# Patient Record
Sex: Female | Born: 1975 | Race: White | Hispanic: No | Marital: Single | Smoking: Former smoker
Health system: Southern US, Community
[De-identification: ages and names within clinical notes are randomized; demographics above are authoritative.]

## PROBLEM LIST (undated history)

## (undated) DIAGNOSIS — F1910 Other psychoactive substance abuse, uncomplicated: Secondary | ICD-10-CM

## (undated) DIAGNOSIS — D649 Anemia, unspecified: Secondary | ICD-10-CM

## (undated) DIAGNOSIS — F419 Anxiety disorder, unspecified: Secondary | ICD-10-CM

## (undated) DIAGNOSIS — R87629 Unspecified abnormal cytological findings in specimens from vagina: Secondary | ICD-10-CM

## (undated) DIAGNOSIS — M5430 Sciatica, unspecified side: Secondary | ICD-10-CM

## (undated) DIAGNOSIS — F32A Depression, unspecified: Secondary | ICD-10-CM

## (undated) DIAGNOSIS — F329 Major depressive disorder, single episode, unspecified: Secondary | ICD-10-CM

## (undated) HISTORY — DX: Unspecified abnormal cytological findings in specimens from vagina: R87.629

## (undated) HISTORY — PX: NO PAST SURGERIES: SHX2092

## (undated) HISTORY — PX: TUBAL LIGATION: SHX77

## (undated) HISTORY — DX: Anxiety disorder, unspecified: F41.9

## (undated) HISTORY — DX: Depression, unspecified: F32.A

## (undated) HISTORY — DX: Major depressive disorder, single episode, unspecified: F32.9

## (undated) HISTORY — PX: LEEP: SHX91

## (undated) HISTORY — DX: Anemia, unspecified: D64.9

---

## 2014-09-04 ENCOUNTER — Emergency Department (HOSPITAL_COMMUNITY)
Admission: EM | Admit: 2014-09-04 | Discharge: 2014-09-04 | Disposition: A | Discharge status: Home / Self Care | Payer: Self-pay | Attending: Emergency Medicine | Admitting: Emergency Medicine

## 2014-09-04 ENCOUNTER — Encounter (HOSPITAL_COMMUNITY): Payer: Self-pay | Admitting: Emergency Medicine

## 2014-09-04 DIAGNOSIS — Z79899 Other long term (current) drug therapy: Secondary | ICD-10-CM | POA: Insufficient documentation

## 2014-09-04 DIAGNOSIS — M5442 Lumbago with sciatica, left side: Secondary | ICD-10-CM | POA: Insufficient documentation

## 2014-09-04 DIAGNOSIS — Z720 Tobacco use: Secondary | ICD-10-CM | POA: Insufficient documentation

## 2014-09-04 MED ORDER — METHOCARBAMOL 500 MG PO TABS: 500.0000 mg | ORAL_TABLET | Freq: Two times a day (BID) | ORAL | Status: DC

## 2014-09-04 MED ORDER — HYDROCODONE-ACETAMINOPHEN 5-325 MG PO TABS: 1.0000 | ORAL_TABLET | ORAL | Status: DC | PRN

## 2014-09-04 NOTE — ED Provider Notes (Signed)
CSN: [id number]     Arrival date & time 09/04/14  1615 History   First MD Initiated Contact with Patient 09/04/14 1642     Chief Complaint  Patient presents with  . Back Pain     (Consider location/radiation/quality/duration/timing/severity/associated sxs/prior Treatment) Patient is a 39 y.o. female presenting with back pain. The history is provided by the patient.  Back Pain Location:  Lumbar spine Radiates to:  R posterior upper leg and L posterior upper leg Pain severity:  Severe Pain is:  Same all the time Onset quality:  Gradual Duration:  4 days Timing:  Constant Progression:  Worsening Chronicity:  New Relieved by:  Nothing Worsened by:  Movement, twisting, bending, ambulation and lying down Ineffective treatments:  Muscle relaxants and NSAIDs Associated symptoms: leg pain   Associated symptoms: no abdominal pain, no bladder incontinence, no bowel incontinence and no dysuria     History reviewed. No pertinent past medical history. History reviewed. No pertinent past surgical history. Family History  Problem Relation Age of Onset  . Heart failure Mother   . Cancer Other    History  Substance Use Topics  . Smoking status: Current Every Day Smoker -- 0.50 packs/day    Types: Cigarettes  . Smokeless tobacco: Never Used  . Alcohol Use: No   OB History    Gravida Para Term Preterm AB TAB SAB Ectopic Multiple Living   5 3 3  2 2          Review of Systems  Gastrointestinal: Negative for abdominal pain and bowel incontinence.  Genitourinary: Negative for bladder incontinence and dysuria.  Musculoskeletal: Positive for back pain.  all other systems negative    Allergies  Codeine  Home Medications   Prior to Admission medications   Medication Sig Start Date End Date Taking? Authorizing Provider  HYDROcodone-acetaminophen (NORCO/VICODIN) 5-325 MG per tablet Take 1 tablet by mouth every 4 (four) hours as needed. 09/04/14   Hope Orlene OchM Neese, NP  methocarbamol  (ROBAXIN) 500 MG tablet Take 1 tablet (500 mg total) by mouth 2 (two) times daily. 09/04/14   Hope Orlene OchM Neese, NP   BP 121/85 mmHg  Pulse 69  Temp(Src) 98.1 F (36.7 C) (Oral)  Resp 16  Ht 5\' 3"  (1.6 m)  Wt 223 lb (101.152 kg)  BMI 39.51 kg/m2  SpO2 100%  LMP 09/04/2014 (Exact Date) Physical Exam  Constitutional: She is oriented to person, place, and time. She appears well-developed and well-nourished. No distress.  HENT:  Head: Normocephalic and atraumatic.  Right Ear: Tympanic membrane normal.  Left Ear: Tympanic membrane normal.  Nose: Nose normal.  Mouth/Throat: Uvula is midline, oropharynx is clear and moist and mucous membranes are normal.  Eyes: Conjunctivae and EOM are normal.  Neck: Normal range of motion. Neck supple.  Cardiovascular: Normal rate and regular rhythm.   Pulmonary/Chest: Effort normal. She has no wheezes. She has no rales.  Abdominal: Soft. Bowel sounds are normal. There is no tenderness.  Musculoskeletal: Normal range of motion.       Lumbar back: She exhibits tenderness, pain and spasm. She exhibits no deformity and normal pulse. Decreased range of motion: due to pain.       Back:  Tender with range of motion of back and with palpation over the left sciatic nerve.   Neurological: She is alert and oriented to person, place, and time. She has normal strength. No cranial nerve deficit or sensory deficit. Gait normal.  Reflex Scores:      Bicep  reflexes are 2+ on the right side and 2+ on the left side.      Brachioradialis reflexes are 2+ on the right side and 2+ on the left side.      Patellar reflexes are 2+ on the right side and 2+ on the left side.      Achilles reflexes are 2+ on the right side and 2+ on the left side. Pedal pulses equal, straight leg raises without difficulty but complains of pain.   Skin: Skin is warm and dry.  Psychiatric: She has a normal mood and affect. Her behavior is normal.  Nursing note and vitals reviewed.   ED Course    Procedures (including critical care time) Labs Review  MDM  39 y.o. female with low back pain without known injury. Stable for discharge without neuro deficits. Will treat for pain and muscle spasm and she will continue to take the Naprosyn. She will follow up with Dr. Romeo Apple is symptoms persist.  Discussed with the patient and all questioned fully answered.   Final diagnoses:  Bilateral low back pain with left-sided sciatica      Janne Napoleon, NP 09/04/14 1705  Rolland Porter, MD 09/21/14 [phone]

## 2014-09-04 NOTE — Discharge Instructions (Signed)
Continue to take the Naprosyn, do not drive while taking the muscle relaxant or the narcotic because they will make you sleepy. Follow up with Dr. Alto DenverHarrsion or return here as needed.   Back Pain, Adult Back pain is very common. The pain often gets better over time. The cause of back pain is usually not dangerous. Most people can learn to manage their back pain on their own.  HOME CARE   Stay active. Start with short walks on flat ground if you can. Try to walk farther each day.  Do not sit, drive, or stand in one place for more than 30 minutes. Do not stay in bed.  Do not avoid exercise or work. Activity can help your back heal faster.  Be careful when you bend or lift an object. Bend at your knees, keep the object close to you, and do not twist.  Sleep on a firm mattress. Lie on your side, and bend your knees. If you lie on your back, put a pillow under your knees.  Only take medicines as told by your doctor.  Put ice on the injured area.  Put ice in a plastic bag.  Place a towel between your skin and the bag.  Leave the ice on for 15-20 minutes, 03-04 times a day for the first 2 to 3 days. After that, you can switch between ice and heat packs.  Ask your doctor about back exercises or massage.  Avoid feeling anxious or stressed. Find good ways to deal with stress, such as exercise. GET HELP RIGHT AWAY IF:   Your pain does not go away with rest or medicine.  Your pain does not go away in 1 week.  You have new problems.  You do not feel well.  The pain spreads into your legs.  You cannot control when you poop (bowel movement) or pee (urinate).  Your arms or legs feel weak or lose feeling (numbness).  You feel sick to your stomach (nauseous) or throw up (vomit).  You have belly (abdominal) pain.  You feel like you may pass out (faint). MAKE SURE YOU:   Understand these instructions.  Will watch your condition.  Will get help right away if you are not doing well or  get worse. Document Released: 12/08/2007 Document Revised: 09/13/2011 Document Reviewed: 10/23/2013 [facility] Patient Information 2015 [place], [place]. This information is not intended to replace advice given to you by your health care provider. Make sure you discuss any questions you have with your health care provider.

## 2014-09-04 NOTE — ED Notes (Signed)
Onset of low back pain with radiation into R buttock.

## 2014-10-21 ENCOUNTER — Emergency Department (HOSPITAL_COMMUNITY)
Admission: EM | Admit: 2014-10-21 | Discharge: 2014-10-21 | Disposition: A | Discharge status: Home / Self Care | Payer: Self-pay | Attending: Emergency Medicine | Admitting: Emergency Medicine

## 2014-10-21 ENCOUNTER — Encounter (HOSPITAL_COMMUNITY): Payer: Self-pay | Admitting: Emergency Medicine

## 2014-10-21 ENCOUNTER — Emergency Department (HOSPITAL_COMMUNITY): Payer: Self-pay

## 2014-10-21 DIAGNOSIS — M67472 Ganglion, left ankle and foot: Secondary | ICD-10-CM | POA: Insufficient documentation

## 2014-10-21 DIAGNOSIS — Z79899 Other long term (current) drug therapy: Secondary | ICD-10-CM | POA: Insufficient documentation

## 2014-10-21 DIAGNOSIS — Z7952 Long term (current) use of systemic steroids: Secondary | ICD-10-CM | POA: Insufficient documentation

## 2014-10-21 DIAGNOSIS — Z720 Tobacco use: Secondary | ICD-10-CM | POA: Insufficient documentation

## 2014-10-21 MED ORDER — PREDNISONE 20 MG PO TABS
40.0000 mg | ORAL_TABLET | Freq: Once | ORAL | Status: AC
  Administered 2014-10-21: 40 mg via ORAL
  Filled 2014-10-21: qty 2

## 2014-10-21 MED ORDER — HYDROCODONE-ACETAMINOPHEN 5-325 MG PO TABS: 1.0000 | ORAL_TABLET | ORAL | Status: DC | PRN

## 2014-10-21 MED ORDER — PREDNISONE 10 MG PO TABS: 20.0000 mg | ORAL_TABLET | Freq: Two times a day (BID) | ORAL | Status: DC

## 2014-10-21 NOTE — ED Provider Notes (Signed)
CSN: [id number]     Arrival date & time 10/21/14  [id number] History  This chart was scribed for non-physician practitioner Alicia BuffaloHope Kaleea Penner, Alicia Barnett, working with Alicia PorterMark James, MD by Alicia Barnett, Alicia Barnett. This patient was seen in room APFT24/APFT24 and the patient's care was started at 12:09 PM.     Chief Complaint  Patient presents with  . Foot Pain   Patient is a 39 y.o. female presenting with lower extremity pain. The history is provided by the patient. No language interpreter was used.  Foot Pain This is a new problem. The current episode started more than 2 days ago. The problem occurs constantly. The problem has been gradually worsening. The symptoms are aggravated by standing and walking. Nothing relieves the symptoms. The treatment provided no relief.    HPI Comments: Alicia Barnett is a 39 y.o. female who presents to the Emergency Department complaining of gradual onset left foot pain due to a possible cyst that started to worsen a few days ago after starting work again. She feels as if the cyst in growing in size. Patient notes the pain is worse at night, after being on her feet then taking off her shoes. She has tried ice and ibuprofen ([phone] mg per day) but without relief. 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   History reviewed. No pertinent past medical history. History reviewed. No pertinent past surgical history. Family History  Problem Relation Age of Onset  . Heart failure Mother   . Cancer Other    History  Substance Use Topics  . Smoking status: Current Every Day Smoker -- 0.50 packs/day    Types: Cigarettes  . Smokeless tobacco: Never Used  . Alcohol Use: No   OB History    Gravida Para Term Preterm AB TAB SAB Ectopic Multiple Living   5 3 3  2 2          Review of Systems  Constitutional: Negative for fever.  Skin:       Left  foot pain  Psychiatric/Behavioral: Negative for confusion.      Allergies  Codeine  Home Medications   Prior to Admission medications   Medication Sig Start Date End Date Taking? Authorizing Provider  busPIRone (BUSPAR) 10 MG tablet Take 10 mg by mouth 2 (two) times daily.   Yes Historical Provider, MD  FLUoxetine (PROZAC) 40 MG capsule Take 40 mg by mouth daily.   Yes Historical Provider, MD  HYDROcodone-acetaminophen (NORCO/VICODIN) 5-325 MG per tablet Take 1 tablet by mouth every 4 (four) hours as needed. 10/21/14   Alicia Klingel Orlene OchM Matilyn Fehrman, Alicia Barnett  predniSONE (DELTASONE) 10 MG tablet Take 2 tablets (20 mg total) by mouth 2 (two) times daily with a meal. 10/21/14   Alicia Pignato Orlene OchM Alexya Mcdaris, Alicia Barnett   BP 120/87 mmHg  Pulse 78  Temp(Src) 98.8 F (37.1 C) (Oral)  Resp 20  Ht 5\' 3"  (1.6 m)  Wt 225 lb (102.059 kg)  BMI 39.87 kg/m2  SpO2 100%  LMP 10/07/2014 Physical Exam  Constitutional: She is oriented to person, place, and time. She appears well-developed and well-nourished.  HENT:  Head: Normocephalic.  Eyes: EOM are normal.  Neck: Neck supple.  Cardiovascular: Normal rate and intact distal pulses.   Pulmonary/Chest: Effort normal.  Musculoskeletal: Normal range of motion.  Pulses 2+ bilateral, adequate circulation.   Neurological: She is alert  and oriented to person, place, and time. No cranial nerve deficit.  Skin: Skin is warm and dry.  Soft, approximately 1.5 cm cystic area that is palpated on dorsum of left foot near the ankle; tender on palpation.  Psychiatric: She has a normal mood and affect. Her behavior is normal.  Nursing note and vitals reviewed.   Alicia Course  Procedures  DIAGNOSTIC STUDIES: Oxygen Saturation is 100% on room air, normal by my interpretation.    COORDINATION OF CARE: 12:14 PM-Discussed treatment plan which includes steroids, medications and ace wrap with pt at bedside and pt agreed to plan.    Labs Review Labs Reviewed - No data to display  Imaging Review Dg Foot  Complete Left  10/21/2014   CLINICAL DATA:  Third to fifth metatarsal pain and swelling for 3 months  EXAM: LEFT FOOT - COMPLETE 3+ VIEW  COMPARISON:  None.  FINDINGS: Three views of the left foot submitted. No acute fracture or subluxation. No radiopaque foreign body.  IMPRESSION: Negative.   Electronically Signed   By: Alicia Barnett M.D.   On: 10/21/2014 10:55     MDM  39 y.o. female with left foot pain and cyst to dorsum of the left foot. Will treat for pain and inflammation. Stable for d/c without neurovascular compromise. Patient to follow up with her PCP or ortho.   Final diagnoses:  Ganglion cyst of left foot   I personally performed the services described in this documentation, which was scribed in my presence. The recorded information has been reviewed and is accurate.    [address] [place], Alicia Barnett 10/22/14 1720  Alicia Porter, MD 10/25/14 [phone]

## 2014-10-21 NOTE — Discharge Instructions (Signed)

## 2014-10-21 NOTE — ED Notes (Signed)
Pt reports possible cyst on her L foot, dorsal surface. Pt states symptoms have become worse since starting work.

## 2014-11-05 ENCOUNTER — Encounter (HOSPITAL_COMMUNITY): Payer: Self-pay | Admitting: *Deleted

## 2014-11-05 ENCOUNTER — Emergency Department (HOSPITAL_COMMUNITY)
Admission: EM | Admit: 2014-11-05 | Discharge: 2014-11-05 | Disposition: A | Discharge status: Home / Self Care | Payer: Self-pay | Attending: Emergency Medicine | Admitting: Emergency Medicine

## 2014-11-05 ENCOUNTER — Emergency Department (HOSPITAL_COMMUNITY): Payer: Self-pay

## 2014-11-05 DIAGNOSIS — R51 Headache: Secondary | ICD-10-CM | POA: Insufficient documentation

## 2014-11-05 DIAGNOSIS — Z79899 Other long term (current) drug therapy: Secondary | ICD-10-CM | POA: Insufficient documentation

## 2014-11-05 DIAGNOSIS — J018 Other acute sinusitis: Secondary | ICD-10-CM | POA: Insufficient documentation

## 2014-11-05 DIAGNOSIS — Z720 Tobacco use: Secondary | ICD-10-CM | POA: Insufficient documentation

## 2014-11-05 DIAGNOSIS — J329 Chronic sinusitis, unspecified: Secondary | ICD-10-CM

## 2014-11-05 DIAGNOSIS — J069 Acute upper respiratory infection, unspecified: Secondary | ICD-10-CM | POA: Insufficient documentation

## 2014-11-05 LAB — RAPID STREP SCREEN (MED CTR MEBANE ONLY): Streptococcus, Group A Screen (Direct): NEGATIVE

## 2014-11-05 MED ORDER — LORATADINE-PSEUDOEPHEDRINE ER 5-120 MG PO TB12: 1.0000 | ORAL_TABLET | Freq: Two times a day (BID) | ORAL | Status: DC

## 2014-11-05 MED ORDER — HYDROCOD POLST-CPM POLST ER 10-8 MG/5ML PO SUER
5.0000 mL | Freq: Once | ORAL | Status: AC
  Administered 2014-11-05: 5 mL via ORAL
  Filled 2014-11-05: qty 5

## 2014-11-05 MED ORDER — HYDROCODONE-HOMATROPINE 5-1.5 MG/5ML PO SYRP: 5.0000 mL | ORAL_SOLUTION | Freq: Four times a day (QID) | ORAL | Status: DC | PRN

## 2014-11-05 NOTE — ED Provider Notes (Signed)
CSN: [id number]     Arrival date & time 11/05/14  2013 History   First MD Initiated Contact with Patient 11/05/14 2218     Chief Complaint  Patient presents with  . Sore Throat     (Consider location/radiation/quality/duration/timing/severity/associated sxs/prior Treatment) HPI Comments: Patient is a 39 year old female who presents to the emergency department with complaint of sore throat and upper respiratory symptoms.  The patient states that she has a family member who was recently diagnosed with strep. She has been having sore throat, cough, and congestion for nearly 2 weeks. She was concerned she may have also contracted strep. The patient denies hemoptysis. She states that she has not had any high fever. She is able to swallow both liquids and solids, but does have some discomfort. There's been no unusual rash reported. The patient has had some headache.  Patient is a 39 y.o. female presenting with pharyngitis. The history is provided by the patient.  Sore Throat Associated symptoms include congestion, fatigue, headaches, myalgias and a sore throat.    History reviewed. No pertinent past medical history. History reviewed. No pertinent past surgical history. Family History  Problem Relation Age of Onset  . Heart failure Mother   . Cancer Other    History  Substance Use Topics  . Smoking status: Current Every Day Smoker -- 0.50 packs/day    Types: Cigarettes  . Smokeless tobacco: Never Used  . Alcohol Use: No   OB History    Gravida Para Term Preterm AB TAB SAB Ectopic Multiple Living   5 3 3  2 2          Review of Systems  Constitutional: Positive for activity change and fatigue.  HENT: Positive for congestion, postnasal drip and sore throat.   Musculoskeletal: Positive for myalgias.  Neurological: Positive for headaches.  All other systems reviewed and are negative.     Allergies  Codeine  Home Medications   Prior to Admission medications   Medication Sig  Start Date End Date Taking? Authorizing Provider  busPIRone (BUSPAR) 10 MG tablet Take 10 mg by mouth 2 (two) times daily.   Yes Historical Provider, MD  FLUoxetine (PROZAC) 40 MG capsule Take 40 mg by mouth daily.   Yes Historical Provider, MD  traZODone (DESYREL) 50 MG tablet Take 50 mg by mouth at bedtime.   Yes Historical Provider, MD  HYDROcodone-acetaminophen (NORCO/VICODIN) 5-325 MG per tablet Take 1 tablet by mouth every 4 (four) hours as needed. Patient not taking: Reported on 11/05/2014 10/21/14   Janne NapoleonHope M Neese, NP  predniSONE (DELTASONE) 10 MG tablet Take 2 tablets (20 mg total) by mouth 2 (two) times daily with a meal. Patient not taking: Reported on 11/05/2014 10/21/14   Janne NapoleonHope M Neese, NP   BP 110/79 mmHg  Pulse 79  Temp(Src) 99.2 F (37.3 C) (Oral)  Resp 16  Ht 5\' 3"  (1.6 m)  Wt 225 lb (102.059 kg)  BMI 39.87 kg/m2  SpO2 100%  LMP 10/22/2014 Physical Exam  Constitutional: She is oriented to person, place, and time. She appears well-developed and well-nourished.  Non-toxic appearance.  HENT:  Head: Normocephalic.  Right Ear: Tympanic membrane and external ear normal.  Left Ear: Tympanic membrane and external ear normal.  Mouth/Throat: No oropharyngeal exudate.  Nasal congestion present.  Eyes: EOM and lids are normal. Pupils are equal, round, and reactive to light.  Neck: Normal range of motion. Neck supple. Carotid bruit is not present.  Cardiovascular: Normal rate, regular rhythm, normal heart sounds,  intact distal pulses and normal pulses.   Pulmonary/Chest: Breath sounds normal. No respiratory distress. She has no wheezes. She has no rales. She exhibits tenderness.  Abdominal: Soft. Bowel sounds are normal. There is no tenderness. There is no guarding.  Musculoskeletal: Normal range of motion.  Lymphadenopathy:       Head (right side): No submandibular adenopathy present.       Head (left side): No submandibular adenopathy present.    She has no cervical adenopathy.   Neurological: She is alert and oriented to person, place, and time. She has normal strength. No cranial nerve deficit or sensory deficit.  Skin: Skin is warm and dry. No rash noted.  Psychiatric: She has a normal mood and affect. Her speech is normal.  Nursing note and vitals reviewed.   ED Course  Procedures (including critical care time) Labs Review Labs Reviewed  RAPID STREP SCREEN  CULTURE, GROUP A STREP    Imaging Review Dg Chest 2 View  11/05/2014   CLINICAL DATA:  Productive cough for 1 week  EXAM: CHEST  2 VIEW  COMPARISON:  None.  FINDINGS: There is mild hyperinflation. Cardiac silhouette is upper normal in size. Hilar and mediastinal contours are unremarkable. The lungs are clear. There are no pleural effusions.  IMPRESSION: Mild hyperinflation.   Electronically Signed   By: Ellery Plunk M.D.   On: 11/05/2014 21:47     EKG Interpretation None      MDM  Vital signs are nonacute. Strep test is negative. Chest x-ray is negative for acute problems. Patient advised to increase fluids, use Claritin-D for congestion, and hycoden for cough.    Final diagnoses:  None    **I have reviewed nursing notes, vital signs, and all appropriate lab and imaging results for this patient.    Ivery Quale, PA-C 11/05/14 2337  Blane Ohara, MD 11/06/14 [phone]

## 2014-11-05 NOTE — Discharge Instructions (Signed)
Please wash hands frequently. Please increase fluids. Please use Claritin-D every 12 hours for congestion and to assist with cough. Please use Hycodan syrup every 6 hours for cough and congestion. This medication may cause drowsiness, please use with caution. Please use Tylenol or ibuprofen for fever or soreness. Salt water gargles maybe helpful. Your strep test is negative. Your chest x-ray is also negative for any acute event. Cough, Adult  A cough is a reflex. It helps you clear your throat and airways. A cough can help heal your body. A cough can last 2 or 3 weeks (acute) or may last more than 8 weeks (chronic). Some common causes of a cough can include an infection, allergy, or a cold. HOME CARE  Only take medicine as told by your doctor.  If given, take your medicines (antibiotics) as told. Finish them even if you start to feel better.  Use a cold steam vaporizer or humidifier in your home. This can help loosen thick spit (secretions).  Sleep so you are almost sitting up (semi-upright). Use pillows to do this. This helps reduce coughing.  Rest as needed.  Stop smoking if you smoke. GET HELP RIGHT AWAY IF:  You have yellowish-white fluid (pus) in your thick spit.  Your cough gets worse.  Your medicine does not reduce coughing, and you are losing sleep.  You cough up blood.  You have trouble breathing.  Your pain gets worse and medicine does not help.  You have a fever. MAKE SURE YOU:   Understand these instructions.  Will watch your condition.  Will get help right away if you are not doing well or get worse. Document Released: 03/04/2011 Document Revised: 11/05/2013 Document Reviewed: 03/04/2011 [facility] Patient Information 2015 [place], [place]. This information is not intended to replace advice given to you by your health care provider. Make sure you discuss any questions you have with your health care provider.  Sinusitis Sinusitis is redness, soreness, and puffiness  (inflammation) of the air pockets in the bones of your face (sinuses). The redness, soreness, and puffiness can cause air and mucus to get trapped in your sinuses. This can allow germs to grow and cause an infection.  HOME CARE   Drink enough fluids to keep your pee (urine) clear or pale yellow.  Use a humidifier in your home.  Run a hot shower to create steam in the bathroom. Sit in the bathroom with the door closed. Breathe in the steam 3-4 times a day.  Put a warm, moist washcloth on your face 3-4 times a day, or as told by your doctor.  Use salt water sprays (saline sprays) to wet the thick fluid in your nose. This can help the sinuses drain.  Only take medicine as told by your doctor. GET HELP RIGHT AWAY IF:   Your pain gets worse.  You have very bad headaches.  You are sick to your stomach (nauseous).  You throw up (vomit).  You are very sleepy (drowsy) all the time.  Your face is puffy (swollen).  Your vision changes.  You have a stiff neck.  You have trouble breathing. MAKE SURE YOU:   Understand these instructions.  Will watch your condition.  Will get help right away if you are not doing well or get worse. Document Released: 12/08/2007 Document Revised: 03/15/2012 Document Reviewed: 01/25/2012 [facility] CityExitCare Patient Information 2015 [place]ExitCare, [place]LL[place]. This information is not intended to replace advice given to you by your health care provider. Make sure you discuss any questions you  have with your health care provider. ° °

## 2014-11-05 NOTE — ED Notes (Signed)
Pt reporting sore throat for aprox 2 weeks, reports burning sensation in throat.  Reports recent exposure to strep throat. Reporting cough for aprox 2 weeks as well.

## 2014-11-08 LAB — CULTURE, GROUP A STREP: STREP A CULTURE: NEGATIVE

## 2014-11-22 ENCOUNTER — Emergency Department (HOSPITAL_COMMUNITY)
Admission: EM | Admit: 2014-11-22 | Discharge: 2014-11-22 | Disposition: A | Discharge status: Home / Self Care | Payer: Self-pay | Attending: Emergency Medicine | Admitting: Emergency Medicine

## 2014-11-22 ENCOUNTER — Encounter (HOSPITAL_COMMUNITY): Payer: Self-pay | Admitting: Emergency Medicine

## 2014-11-22 DIAGNOSIS — R1110 Vomiting, unspecified: Secondary | ICD-10-CM | POA: Insufficient documentation

## 2014-11-22 DIAGNOSIS — Z720 Tobacco use: Secondary | ICD-10-CM | POA: Insufficient documentation

## 2014-11-22 DIAGNOSIS — J209 Acute bronchitis, unspecified: Secondary | ICD-10-CM | POA: Insufficient documentation

## 2014-11-22 DIAGNOSIS — Z79899 Other long term (current) drug therapy: Secondary | ICD-10-CM | POA: Insufficient documentation

## 2014-11-22 DIAGNOSIS — J40 Bronchitis, not specified as acute or chronic: Secondary | ICD-10-CM

## 2014-11-22 MED ORDER — PREDNISONE 10 MG PO TABS: ORAL_TABLET | ORAL | Status: DC

## 2014-11-22 MED ORDER — AZITHROMYCIN 250 MG PO TABS: 250.0000 mg | ORAL_TABLET | Freq: Every day | ORAL | Status: DC

## 2014-11-22 MED ORDER — BENZONATATE 200 MG PO CAPS: 200.0000 mg | ORAL_CAPSULE | Freq: Three times a day (TID) | ORAL | Status: DC | PRN

## 2014-11-22 MED ORDER — ALBUTEROL SULFATE HFA 108 (90 BASE) MCG/ACT IN AERS
1.0000 | INHALATION_SPRAY | RESPIRATORY_TRACT | Status: DC
Start: 2014-11-22 — End: 2014-11-22
  Administered 2014-11-22: 2 via RESPIRATORY_TRACT
  Filled 2014-11-22: qty 6.7

## 2014-11-22 NOTE — ED Provider Notes (Signed)
CSN: [id number]     Arrival date & time 11/22/14  1216 History   First MD Initiated Contact with Patient 11/22/14 1243     Chief Complaint  Patient presents with  . Cough     (Consider location/radiation/quality/duration/timing/severity/associated sxs/prior Treatment) Patient is a 39 y.o. female presenting with cough. The history is provided by the patient.  Cough Cough characteristics:  Productive Sputum characteristics:  Yellow Severity:  Moderate Onset quality:  Gradual Duration:  3 weeks Associated symptoms: sore throat and wheezing  Fever: ?    Alicia Barnett is a 39 y.o. female who presents to the ED for productive cough and congestion. She was evaluated here approximately 3 weeks ago for sore throat, congestion and URI symptoms. Since that time the cough has increased and now she reports is yellow sputum and she coughs until she vomits. CXR on previous visit normal.  History reviewed. No pertinent past medical history. History reviewed. No pertinent past surgical history. Family History  Problem Relation Age of Onset  . Heart failure Mother   . Cancer Other    History  Substance Use Topics  . Smoking status: Current Every Day Smoker -- 0.10 packs/day    Types: Cigarettes  . Smokeless tobacco: Never Used  . Alcohol Use: No   OB History    Gravida Para Term Preterm AB TAB SAB Ectopic Multiple Living   5 3 3  2 2          Review of Systems  Constitutional: Fever: ?  HENT: Positive for congestion and sore throat.   Respiratory: Positive for cough and wheezing.   Gastrointestinal: Positive for vomiting (with cough).  all other systems negative    Allergies  Codeine  Home Medications   Prior to Admission medications   Medication Sig Start Date End Date Taking? Authorizing Provider  busPIRone (BUSPAR) 10 MG tablet Take 10 mg by mouth 2 (two) times daily.   Yes Historical Provider, MD  FLUoxetine (PROZAC) 40 MG capsule Take 40 mg by mouth daily.   Yes Historical  Provider, MD  traZODone (DESYREL) 50 MG tablet Take 50 mg by mouth at bedtime as needed for sleep.    Yes Historical Provider, MD  azithromycin (ZITHROMAX) 250 MG tablet Take 1 tablet (250 mg total) by mouth daily. Take first 2 tablets together, then 1 every day until finished. 11/22/14   Denman Pichardo Orlene OchM Jahmar Mckelvy, NP  benzonatate (TESSALON) 200 MG capsule Take 1 capsule (200 mg total) by mouth 3 (three) times daily as needed for cough. 11/22/14   Yoandri Congrove Orlene OchM Keilyn Haggard, NP  loratadine-pseudoephedrine (CLARITIN-D 12 HOUR) 5-120 MG per tablet Take 1 tablet by mouth 2 (two) times daily. Patient not taking: Reported on 11/22/2014 11/05/14   Ivery QualeHobson Bryant, PA-C  predniSONE (DELTASONE) 10 MG tablet Take 6 tablets PO today then 5,4,3,2,1 11/22/14   Siyana Erney Orlene OchM Irma Roulhac, NP   BP 138/88 mmHg  Pulse 74  Temp(Src) 97.9 F (36.6 C) (Oral)  Resp 16  Ht 5\' 3"  (1.6 m)  Wt 235 lb (106.595 kg)  BMI 41.64 kg/m2  SpO2 100%  LMP 11/22/2014 Physical Exam  Constitutional: She is oriented to person, place, and time. She appears well-developed and well-nourished.  HENT:  Head: Normocephalic and atraumatic.  Eyes: Conjunctivae and EOM are normal.  Neck: Neck supple.  Cardiovascular: Normal rate and regular rhythm.   Pulmonary/Chest: Effort normal. She has decreased breath sounds. Wheezes: occasional.  Abdominal: Soft. There is no tenderness.  Musculoskeletal: Normal range of motion.  Neurological: She  is alert and oriented to person, place, and time. No cranial nerve deficit.  Skin: Skin is warm and dry.  Psychiatric: She has a normal mood and affect. Her behavior is normal.  Nursing note and vitals reviewed.   ED Course  Procedures  Albuterol inhaler with instructions on use here in the ED by respiratory. Patient reports that it does help open her up.   MDM  39 y.o. female with productive cough, congestion, wheezing, stable for d/c without respiratory distress, O2 SAT 100% on R/A. Will treat with prednisone, cough medication, inhaler  and Zpak. Patient currently without PCP. She will return for worsening symptoms.  Final diagnoses:  Bronchitis       Janne NapoleonHope M Tiarah Shisler, NP 11/23/14 [id number]  Tilden FossaElizabeth Rees, MD 11/23/14 [phone]

## 2014-11-22 NOTE — ED Notes (Signed)
Pt reports seen for same x3 weeks ago. Pt reports continued cough. Pt also reports " i cough so much now that i vomit." nad noted.

## 2014-11-22 NOTE — ED Notes (Signed)
Cough for 1 month. Seen here for same. Cont to have cough, with yellow sputum.  And sore throat.Alert, NAD, Has post tussive vomiting. Last fever over 2 weeks ago.

## 2014-11-22 NOTE — Discharge Instructions (Signed)
Metered Dose Inhaler (No Spacer Used)  Inhaled medicines are the basis of treatment for asthma and other breathing problems. Inhaled medicine can only be effective if used properly. Good technique assures that the medicine reaches the lungs.  Metered dose inhalers (MDIs) are used to deliver a variety of inhaled medicines. These include quick relief or rescue medicines (such as bronchodilators) and controller medicines (such as corticosteroids). The medicine is delivered by pushing down on a metal canister to release a set amount of spray.  If you are using different kinds of inhalers, use your quick relief medicine to open the airways 10-15 minutes before using a steroid, if instructed to do so by your health care provider. If you are unsure which inhalers to use and the order of using them, ask your health care provider, nurse, or respiratory therapist.  HOW TO USE THE INHALER  1. Remove the cap from the inhaler.  2. If you are using the inhaler for the first time, you will need to prime it. Shake the inhaler for 5 seconds and release four puffs into the air, away from your face. Ask your health care provider or pharmacist if you have questions about priming your inhaler.  3. Shake the inhaler for 5 seconds before each breath in (inhalation).  4. Position the inhaler so that the top of the canister faces up.  5. Put your index finger on the top of the medicine canister. Your thumb supports the bottom of the inhaler.  6. Open your mouth.  7. Either place the inhaler between your teeth and place your lips tightly around the mouthpiece, or hold the inhaler 1-2 inches away from your open mouth. If you are unsure of which technique to use, ask your health care provider.  8. Breathe out (exhale) normally and as completely as possible.  9. Press the canister down with the index finger to release the medicine.  10. At the same time as the canister is pressed, inhale deeply and slowly until your lungs are completely filled.  This should take 4-6 seconds. Keep your tongue down.  11. Hold the medicine in your lungs for 5-10 seconds (10 seconds is best). This helps the medicine get into the small airways of your lungs.  12. Breathe out slowly, through pursed lips. Whistling is an example of pursed lips.  13. Wait at least 1 minute between puffs. Continue with the above steps until you have taken the number of puffs your health care provider has ordered. Do not use the inhaler more than your health care provider directs you to.  14. Replace the cap on the inhaler.  15. Follow the directions from your health care provider or the inhaler insert for cleaning the inhaler.  If you are using a steroid inhaler, after your last puff, rinse your mouth with water, gargle, and spit out the water. Do not swallow the water.  AVOID:  · Inhaling before or after starting the spray of medicine. It takes practice to coordinate your breathing with triggering the spray.  · Inhaling through the nose (rather than the mouth) when triggering the spray.  HOW TO DETERMINE IF YOUR INHALER IS FULL OR NEARLY EMPTY  You cannot know when an inhaler is empty by shaking it. Some inhalers are now being made with dose counters. Ask your health care provider for a prescription that has a dose counter if you feel you need that extra help. If your inhaler does not have a counter, ask your health care   provider to help you determine the date you need to refill your inhaler. Write the refill date on a calendar or your inhaler canister. Refill your inhaler 7-10 days before it runs out. Be sure to keep an adequate supply of medicine. This includes making sure it has not expired, and making sure you have a spare inhaler.  SEEK MEDICAL CARE IF:  · Symptoms are only partially relieved with your inhaler.  · You are having trouble using your inhaler.  · You experience an increase in phlegm.  SEEK IMMEDIATE MEDICAL CARE IF:  · You feel little or no relief with your inhalers. You are still  wheezing and feeling shortness of breath, tightness in your chest, or both.  · You have dizziness, headaches, or a fast heart rate.  · You have chills, fever, or night sweats.  · There is a noticeable increase in phlegm production, or there is blood in the phlegm.  MAKE SURE YOU:  · Understand these instructions.  · Will watch your condition.  · Will get help right away if you are not doing well or get worse.  Document Released: 04/18/2007 Document Revised: 11/05/2013 Document Reviewed: 12/07/2012  Exit[employer]® Patient Information ©2015 [employer]. This information is not intended to replace advice given to you by your health care provider. Make sure you discuss any questions you have with your health care provider.

## 2014-12-18 ENCOUNTER — Emergency Department (HOSPITAL_COMMUNITY)
Admission: EM | Admit: 2014-12-18 | Discharge: 2014-12-18 | Disposition: A | Discharge status: Home / Self Care | Payer: Medicaid Other | Attending: Emergency Medicine | Admitting: Emergency Medicine

## 2014-12-18 ENCOUNTER — Emergency Department (HOSPITAL_COMMUNITY): Payer: Medicaid Other

## 2014-12-18 ENCOUNTER — Encounter (HOSPITAL_COMMUNITY): Payer: Self-pay

## 2014-12-18 DIAGNOSIS — Z79899 Other long term (current) drug therapy: Secondary | ICD-10-CM | POA: Diagnosis not present

## 2014-12-18 DIAGNOSIS — Z720 Tobacco use: Secondary | ICD-10-CM | POA: Insufficient documentation

## 2014-12-18 DIAGNOSIS — Y9389 Activity, other specified: Secondary | ICD-10-CM | POA: Insufficient documentation

## 2014-12-18 DIAGNOSIS — Y998 Other external cause status: Secondary | ICD-10-CM | POA: Diagnosis not present

## 2014-12-18 DIAGNOSIS — Y929 Unspecified place or not applicable: Secondary | ICD-10-CM | POA: Diagnosis not present

## 2014-12-18 DIAGNOSIS — S93401A Sprain of unspecified ligament of right ankle, initial encounter: Secondary | ICD-10-CM

## 2014-12-18 DIAGNOSIS — W1839XA Other fall on same level, initial encounter: Secondary | ICD-10-CM | POA: Diagnosis not present

## 2014-12-18 DIAGNOSIS — S99911A Unspecified injury of right ankle, initial encounter: Secondary | ICD-10-CM | POA: Diagnosis present

## 2014-12-18 MED ORDER — HYDROCODONE-ACETAMINOPHEN 5-325 MG PO TABS
2.0000 | ORAL_TABLET | Freq: Once | ORAL | Status: AC
  Administered 2014-12-18: 2 via ORAL
  Filled 2014-12-18: qty 2

## 2014-12-18 MED ORDER — HYDROCODONE-ACETAMINOPHEN 5-325 MG PO TABS: 1.0000 | ORAL_TABLET | ORAL | Status: DC | PRN

## 2014-12-18 MED ORDER — IBUPROFEN 800 MG PO TABS
800.0000 mg | ORAL_TABLET | Freq: Once | ORAL | Status: AC
  Administered 2014-12-18: 800 mg via ORAL
  Filled 2014-12-18: qty 1

## 2014-12-18 MED ORDER — IBUPROFEN 800 MG PO TABS: 800.0000 mg | ORAL_TABLET | Freq: Three times a day (TID) | ORAL | Status: DC

## 2014-12-18 NOTE — Discharge Instructions (Signed)
Your x-rays are negative for fracture or dislocation. Please keep your ankle elevated above your waist over the next 2 or 3 days. Please apply ice pack. Please use crutches until you are able to safely apply weight to the right foot and ankle. Please use your ankle stirrup splint for the next 10-14 days. You do not have to sleep in this device. It should be worn however when you are up and about. Please see Dr. Romeo Apple for orthopedic evaluation if not improving. Please use ibuprofen 3 times daily with food. May use Norco for pain if not improved proved with the ibuprofen. Ankle Sprain An ankle sprain is an injury to the strong, fibrous tissues (ligaments) that hold the bones of your ankle joint together.  CAUSES An ankle sprain is usually caused by a fall or by twisting your ankle. Ankle sprains most commonly occur when you step on the outer edge of your foot, and your ankle turns inward. People who participate in sports are more prone to these types of injuries.  SYMPTOMS   Pain in your ankle. The pain may be present at rest or only when you are trying to stand or walk.  Swelling.  Bruising. Bruising may develop immediately or within 1 to 2 days after your injury.  Difficulty standing or walking, particularly when turning corners or changing directions. DIAGNOSIS  Your caregiver will ask you details about your injury and perform a physical exam of your ankle to determine if you have an ankle sprain. During the physical exam, your caregiver will press on and apply pressure to specific areas of your foot and ankle. Your caregiver will try to move your ankle in certain ways. An X-ray exam may be done to be sure a bone was not broken or a ligament did not separate from one of the bones in your ankle (avulsion fracture).  TREATMENT  Certain types of braces can help stabilize your ankle. Your caregiver can make a recommendation for this. Your caregiver may recommend the use of medicine for pain. If your  sprain is severe, your caregiver may refer you to a surgeon who helps to restore function to parts of your skeletal system (orthopedist) or a physical therapist. HOME CARE INSTRUCTIONS   Apply ice to your injury for 1-2 days or as directed by your caregiver. Applying ice helps to reduce inflammation and pain.  Put ice in a plastic bag.  Place a towel between your skin and the bag.  Leave the ice on for 15-20 minutes at a time, every 2 hours while you are awake.  Only take over-the-counter or prescription medicines for pain, discomfort, or fever as directed by your caregiver.  Elevate your injured ankle above the level of your heart as much as possible for 2-3 days.  If your caregiver recommends crutches, use them as instructed. Gradually put weight on the affected ankle. Continue to use crutches or a cane until you can walk without feeling pain in your ankle.  If you have a plaster splint, wear the splint as directed by your caregiver. Do not rest it on anything harder than a pillow for the first 24 hours. Do not put weight on it. Do not get it wet. You may take it off to take a shower or bath.  You may have been given an elastic bandage to wear around your ankle to provide support. If the elastic bandage is too tight (you have numbness or tingling in your foot or your foot becomes cold and  blue), adjust the bandage to make it comfortable.  If you have an air splint, you may blow more air into it or let air out to make it more comfortable. You may take your splint off at night and before taking a shower or bath. Wiggle your toes in the splint several times per day to decrease swelling. SEEK MEDICAL CARE IF:   You have rapidly increasing bruising or swelling.  Your toes feel extremely cold or you lose feeling in your foot.  Your pain is not relieved with medicine. SEEK IMMEDIATE MEDICAL CARE IF:  Your toes are numb or blue.  You have severe pain that is increasing. MAKE SURE YOU:    Understand these instructions.  Will watch your condition.  Will get help right away if you are not doing well or get worse. Document Released: 06/21/2005 Document Revised: 03/15/2012 Document Reviewed: 07/03/2011 [facility] Patient Information 2015 [place], [place]. This information is not intended to replace advice given to you by your health care provider. Make sure you discuss any questions you have with your health care provider.

## 2014-12-18 NOTE — ED Notes (Signed)
Pt reports stepped in a hole last night and twisted r ankle.  Ankle swollen.

## 2014-12-18 NOTE — ED Provider Notes (Signed)
CSN: [id number]     Arrival date & time 12/18/14  1346 History   First MD Initiated Contact with Patient 12/18/14 1527     Chief Complaint  Patient presents with  . Ankle Pain     (Consider location/radiation/quality/duration/timing/severity/associated sxs/prior Treatment) Patient is a 39 y.o. female presenting with ankle pain. The history is provided by the patient.  Ankle Pain Location:  Ankle Time since incident:  1 day Injury: yes   Mechanism of injury: fall   Fall:    Fall occurred: Pt stepped in a hole.   Impact surface:  Dirt   Entrapped after fall: no   Ankle location:  R ankle Pain details:    Quality:  Aching   Radiates to:  Does not radiate   Severity:  Severe   Onset quality:  Sudden   Duration:  1 day   Timing:  Intermittent   Progression:  Worsening Chronicity:  New Dislocation: no   Prior injury to area:  Yes Relieved by:  Nothing Worsened by:  Bearing weight and flexion Associated symptoms: decreased ROM and swelling   Associated symptoms: no back pain, no neck pain and no numbness   Risk factors: no concern for non-accidental trauma and no recent illness     History reviewed. No pertinent past medical history. No past surgical history on file. Family History  Problem Relation Age of Onset  . Heart failure Mother   . Cancer Other    History  Substance Use Topics  . Smoking status: Current Every Day Smoker -- 0.10 packs/day    Types: Cigarettes  . Smokeless tobacco: Never Used  . Alcohol Use: No   OB History    Gravida Para Term Preterm AB TAB SAB Ectopic Multiple Living   Review of Systems  Constitutional: Negative for activity change.       All ROS Neg except as noted in HPI  HENT: Negative for nosebleeds.   Eyes: Negative for photophobia and discharge.  Respiratory: Negative for cough, shortness of breath and wheezing.   Cardiovascular: Negative for chest pain and palpitations.  Gastrointestinal: Negative for  abdominal pain and blood in stool.  Genitourinary: Negative for dysuria, frequency and hematuria.  Musculoskeletal: Negative for back pain, arthralgias and neck pain.  Skin: Negative.   Neurological: Negative for dizziness, seizures and speech difficulty.  Psychiatric/Behavioral: Negative for hallucinations and confusion.      Allergies  Codeine  Home Medications   Prior to Admission medications   Medication Sig Start Date End Date Taking? Authorizing Provider  busPIRone (BUSPAR) 10 MG tablet Take 40 mg by mouth daily.    Yes Historical Provider, MD  FLUoxetine (PROZAC) 40 MG capsule Take 40 mg by mouth daily.   Yes Historical Provider, MD  traZODone (DESYREL) 50 MG tablet Take 50 mg by mouth at bedtime as needed for sleep.    Yes Historical Provider, MD  azithromycin (ZITHROMAX) 250 MG tablet Take 1 tablet (250 mg total) by mouth daily. Take first 2 tablets together, then 1 every day until finished. Patient not taking: Reported on 12/18/2014 11/22/14   Janne Napoleon, NP  benzonatate (TESSALON) 200 MG capsule Take 1 capsule (200 mg total) by mouth 3 (three) times daily as needed for cough. Patient not taking: Reported on 12/18/2014 11/22/14   Janne Napoleon, NP  loratadine-pseudoephedrine (CLARITIN-D 12 HOUR) 5-120 MG per tablet Take 1 tablet by mouth 2 (two)  times daily. Patient not taking: Reported on 11/22/2014 11/05/14   Ivery Quale, PA-C  predniSONE (DELTASONE) 10 MG tablet Take 6 tablets PO today then 5,4,3,2,1 Patient not taking: Reported on 12/18/2014 11/22/14   Janne Napoleon, NP   BP 117/84 mmHg  Pulse 87  Temp(Src) 98.9 F (37.2 C) (Oral)  Resp 16  Ht 5\' 3"  (1.6 m)  Wt 230 lb (104.327 kg)  BMI 40.75 kg/m2  SpO2 100%  LMP 11/22/2014 Physical Exam  Constitutional: She is oriented to person, place, and time. She appears well-developed and well-nourished.  Non-toxic appearance.  HENT:  Head: Normocephalic.  Right Ear: Tympanic membrane and external ear normal.  Left Ear:  Tympanic membrane and external ear normal.  Eyes: EOM and lids are normal. Pupils are equal, round, and reactive to light.  Neck: Normal range of motion. Neck supple. Carotid bruit is not present.  Cardiovascular: Normal rate, regular rhythm, normal heart sounds, intact distal pulses and normal pulses.   Pulmonary/Chest: Breath sounds normal. No respiratory distress.  Abdominal: Soft. Bowel sounds are normal. There is no tenderness. There is no guarding.  Musculoskeletal:       Right ankle: She exhibits decreased range of motion and swelling. She exhibits no deformity and normal pulse. Tenderness. Lateral malleolus tenderness found. Achilles tendon normal.  Lymphadenopathy:       Head (right side): No submandibular adenopathy present.       Head (left side): No submandibular adenopathy present.    She has no cervical adenopathy.  Neurological: She is alert and oriented to person, place, and time. She has normal strength. No cranial nerve deficit or sensory deficit.  Skin: Skin is warm and dry.  Psychiatric: She has a normal mood and affect. Her speech is normal.  Nursing note and vitals reviewed.   ED Course  Procedures (including critical care time) Labs Review Labs Reviewed - No data to display  Imaging Review Dg Ankle Complete Right  12/18/2014   CLINICAL DATA:  [place]. Diffuse ankle pain. Initial encounter.  EXAM: RIGHT ANKLE - COMPLETE 3+ VIEW  COMPARISON:  None.  FINDINGS: Marked soft tissue swelling about the lateral malleolus. No evidence of fracture or malalignment. No radiopaque foreign body.  IMPRESSION: Soft tissue swelling without fracture.   Electronically Signed   By: Marnee Spring M.D.   On: 12/18/2014 14:41     EKG Interpretation None      MDM  X-ray of the right ankle is negative for fracture or dislocation. The examination is consistent with ankle sprain. The patient is fitted with an ankle stirrup splint, and given an ice pack. Patient  is also fitted with crutches. Prescription for diclofenac 2 times daily, and Norco every 4 hours given to the patient. The patient will follow-up with Dr. Romeo Apple if not improving.    Final diagnoses:  None    *I have reviewed nursing notes, vital signs, and all appropriate lab and imaging results for this patient.    Ivery Quale, PA-C 12/18/14 1555  Samuel Jester, DO 12/21/14 [phone]

## 2015-02-02 ENCOUNTER — Encounter (HOSPITAL_COMMUNITY): Payer: Self-pay | Admitting: Emergency Medicine

## 2015-02-02 ENCOUNTER — Emergency Department (HOSPITAL_COMMUNITY)
Admission: EM | Admit: 2015-02-02 | Discharge: 2015-02-02 | Disposition: A | Discharge status: Home / Self Care | Payer: Medicaid Other | Attending: Emergency Medicine | Admitting: Emergency Medicine

## 2015-02-02 DIAGNOSIS — Z720 Tobacco use: Secondary | ICD-10-CM | POA: Diagnosis not present

## 2015-02-02 DIAGNOSIS — M542 Cervicalgia: Secondary | ICD-10-CM | POA: Insufficient documentation

## 2015-02-02 DIAGNOSIS — J019 Acute sinusitis, unspecified: Secondary | ICD-10-CM | POA: Insufficient documentation

## 2015-02-02 DIAGNOSIS — Z79899 Other long term (current) drug therapy: Secondary | ICD-10-CM | POA: Insufficient documentation

## 2015-02-02 DIAGNOSIS — J069 Acute upper respiratory infection, unspecified: Secondary | ICD-10-CM | POA: Diagnosis not present

## 2015-02-02 DIAGNOSIS — R05 Cough: Secondary | ICD-10-CM | POA: Diagnosis present

## 2015-02-02 DIAGNOSIS — J018 Other acute sinusitis: Secondary | ICD-10-CM

## 2015-02-02 MED ORDER — LORATADINE-PSEUDOEPHEDRINE ER 5-120 MG PO TB12: 1.0000 | ORAL_TABLET | Freq: Two times a day (BID) | ORAL | Status: DC

## 2015-02-02 MED ORDER — PREDNISONE 50 MG PO TABS
60.0000 mg | ORAL_TABLET | Freq: Once | ORAL | Status: AC
  Administered 2015-02-02: 60 mg via ORAL
  Filled 2015-02-02 (×2): qty 1

## 2015-02-02 MED ORDER — PREDNISONE 10 MG PO TABS: ORAL_TABLET | ORAL | Status: DC

## 2015-02-02 MED ORDER — KETOROLAC TROMETHAMINE 10 MG PO TABS
10.0000 mg | ORAL_TABLET | Freq: Once | ORAL | Status: AC
  Administered 2015-02-02: 10 mg via ORAL
  Filled 2015-02-02: qty 1

## 2015-02-02 MED ORDER — METHOCARBAMOL 500 MG PO TABS: 500.0000 mg | ORAL_TABLET | Freq: Three times a day (TID) | ORAL | Status: DC

## 2015-02-02 MED ORDER — HYDROCODONE-HOMATROPINE 5-1.5 MG/5ML PO SYRP: 5.0000 mL | ORAL_SOLUTION | Freq: Four times a day (QID) | ORAL | Status: DC | PRN

## 2015-02-02 NOTE — ED Notes (Signed)
Pt states she recently went to the beach and is now having worsening allergy symptoms. Pt reports cough, sneezing, nasal congestion. Pt reports brown mucous with cough.

## 2015-02-02 NOTE — Discharge Instructions (Signed)
Sinusitis °Sinusitis is redness, soreness, and puffiness (inflammation) of the air pockets in the bones of your face (sinuses). The redness, soreness, and puffiness can cause air and mucus to get trapped in your sinuses. This can allow germs to grow and cause an infection.  °HOME CARE  °· Drink enough fluids to keep your pee (urine) clear or pale yellow. °· Use a humidifier in your home. °· Run a hot shower to create steam in the bathroom. Sit in the bathroom with the door closed. Breathe in the steam 3-4 times a day. °· Put a warm, moist washcloth on your face 3-4 times a day, or as told by your doctor. °· Use salt water sprays (saline sprays) to wet the thick fluid in your nose. This can help the sinuses drain. °· Only take medicine as told by your doctor. °GET HELP RIGHT AWAY IF:  °· Your pain gets worse. °· You have very bad headaches. °· You are sick to your stomach (nauseous). °· You throw up (vomit). °· You are very sleepy (drowsy) all the time. °· Your face is puffy (swollen). °· Your vision changes. °· You have a stiff neck. °· You have trouble breathing. °MAKE SURE YOU:  °· Understand these instructions. °· Will watch your condition. °· Will get help right away if you are not doing well or get worse. °Document Released: 12/08/2007 Document Revised: 03/15/2012 Document Reviewed: 01/25/2012 °[employer]® Patient Information ©2015 [employer]. This information is not intended to replace advice given to you by your health care provider. Make sure you discuss any questions you have with your health care provider. ° °

## 2015-02-07 NOTE — ED Provider Notes (Signed)
CSN: [id number]     Arrival date & time 02/02/15  1033 History   First MD Initiated Contact with Patient 02/02/15 1054     Chief Complaint  Patient presents with  . Nasal Congestion  . Cough     (Consider location/radiation/quality/duration/timing/severity/associated sxs/prior Treatment) Patient is a 39 y.o. female presenting with cough. The history is provided by the patient.  Cough Cough characteristics:  Productive Sputum characteristics:  Manson Passey Severity:  Moderate Onset quality:  Gradual Timing:  Intermittent Progression:  Worsening Chronicity:  New Smoker: yes   Context: sick contacts, upper respiratory infection and weather changes   Relieved by:  Nothing Ineffective treatments:  None tried Associated symptoms: headaches, myalgias and sinus congestion   Risk factors: no recent infection     History reviewed. No pertinent past medical history. History reviewed. No pertinent past surgical history. Family History  Problem Relation Age of Onset  . Heart failure Mother   . Cancer Other    History  Substance Use Topics  . Smoking status: Current Every Day Smoker -- 0.10 packs/day    Types: Cigarettes  . Smokeless tobacco: Never Used  . Alcohol Use: No   OB History    Gravida Para Term Preterm AB TAB SAB Ectopic Multiple Living   5 3 3  2 2          Review of Systems  Respiratory: Positive for cough.   Musculoskeletal: Positive for myalgias.  Neurological: Positive for headaches.  All other systems reviewed and are negative.     Allergies  Codeine  Home Medications   Prior to Admission medications   Medication Sig Start Date End Date Taking? Authorizing Provider  busPIRone (BUSPAR) 10 MG tablet Take 40 mg by mouth daily.    Yes Historical Provider, MD  FLUoxetine (PROZAC) 40 MG capsule Take 40 mg by mouth daily.   Yes Historical Provider, MD  traZODone (DESYREL) 50 MG tablet Take 50 mg by mouth at bedtime as needed for sleep.    Yes Historical Provider,  MD  HYDROcodone-homatropine (HYCODAN) 5-1.5 MG/5ML syrup Take 5 mLs by mouth every 6 (six) hours as needed. 02/02/15   Ivery Quale, PA-C  loratadine-pseudoephedrine (CLARITIN-D 12 HOUR) 5-120 MG per tablet Take 1 tablet by mouth 2 (two) times daily. 02/02/15   Ivery Quale, PA-C  methocarbamol (ROBAXIN) 500 MG tablet Take 1 tablet (500 mg total) by mouth 3 (three) times daily. 02/02/15   Ivery Quale, PA-C  predniSONE (DELTASONE) 10 MG tablet 5,4,3,2,1 - take with food 02/02/15   Ivery Quale, PA-C   BP 142/78 mmHg  Pulse 80  Temp(Src) 98.9 F (37.2 C) (Oral)  Resp 18  Ht 5\' 3"  (1.6 m)  Wt 230 lb (104.327 kg)  BMI 40.75 kg/m2  SpO2 100% Physical Exam  Constitutional: She is oriented to person, place, and time. She appears well-developed and well-nourished.  Non-toxic appearance.  HENT:  Head: Normocephalic.  Right Ear: Tympanic membrane and external ear normal.  Left Ear: Tympanic membrane and external ear normal.  Mouth/Throat: No oropharyngeal exudate.  Nasal congestion  Eyes: EOM and lids are normal. Pupils are equal, round, and reactive to light.  Neck: Normal range of motion. Neck supple. Carotid bruit is not present. No tracheal deviation present.  Soreness with ROM. NO rigidity  Cardiovascular: Normal rate, regular rhythm, normal heart sounds, intact distal pulses and normal pulses.   Pulmonary/Chest: Breath sounds normal. No respiratory distress.  Abdominal: Soft. Bowel sounds are normal. There is no tenderness. There is  no guarding.  Musculoskeletal: Normal range of motion.  Lymphadenopathy:       Head (right side): No submandibular adenopathy present.       Head (left side): No submandibular adenopathy present.    She has no cervical adenopathy.  Neurological: She is alert and oriented to person, place, and time. She has normal strength. No cranial nerve deficit or sensory deficit.  Skin: Skin is warm and dry.  Psychiatric: She has a normal mood and affect. Her speech  is normal.  Nursing note and vitals reviewed.   ED Course  Procedures (including critical care time) Labs Review Labs Reviewed - No data to display  Imaging Review No results found.   EKG Interpretation None      MDM  Exam favors sinusitis and URI.  Discussed findings with patient. Rx for prednisone taper, robaxin, hycodan for cough and claritin D given to the patient.   Final diagnoses:  Other subacute sinusitis  URI (upper respiratory infection)  Neck pain    *I have reviewed nursing notes, vital signs, and all appropriate lab and imaging results for this patient.[address], PA-C 02/07/15 0454  Gerhard Munch, MD 02/08/15 [phone]

## 2015-02-08 ENCOUNTER — Emergency Department (HOSPITAL_COMMUNITY)
Admission: EM | Admit: 2015-02-08 | Discharge: 2015-02-08 | Disposition: A | Discharge status: Home / Self Care | Payer: Medicaid Other | Attending: Emergency Medicine | Admitting: Emergency Medicine

## 2015-02-08 ENCOUNTER — Encounter (HOSPITAL_COMMUNITY): Payer: Self-pay | Admitting: Emergency Medicine

## 2015-02-08 DIAGNOSIS — M542 Cervicalgia: Secondary | ICD-10-CM | POA: Diagnosis present

## 2015-02-08 DIAGNOSIS — R05 Cough: Secondary | ICD-10-CM | POA: Insufficient documentation

## 2015-02-08 DIAGNOSIS — Z720 Tobacco use: Secondary | ICD-10-CM | POA: Diagnosis not present

## 2015-02-08 DIAGNOSIS — R0981 Nasal congestion: Secondary | ICD-10-CM | POA: Insufficient documentation

## 2015-02-08 DIAGNOSIS — M62838 Other muscle spasm: Secondary | ICD-10-CM | POA: Diagnosis not present

## 2015-02-08 DIAGNOSIS — R491 Aphonia: Secondary | ICD-10-CM | POA: Diagnosis not present

## 2015-02-08 DIAGNOSIS — J029 Acute pharyngitis, unspecified: Secondary | ICD-10-CM | POA: Insufficient documentation

## 2015-02-08 DIAGNOSIS — R059 Cough, unspecified: Secondary | ICD-10-CM

## 2015-02-08 MED ORDER — CYCLOBENZAPRINE HCL 10 MG PO TABS: 10.0000 mg | ORAL_TABLET | Freq: Three times a day (TID) | ORAL | Status: DC | PRN

## 2015-02-08 MED ORDER — BENZONATATE 200 MG PO CAPS: 200.0000 mg | ORAL_CAPSULE | Freq: Three times a day (TID) | ORAL | Status: DC | PRN

## 2015-02-08 MED ORDER — AZITHROMYCIN 250 MG PO TABS: ORAL_TABLET | ORAL | Status: DC

## 2015-02-08 NOTE — Discharge Instructions (Signed)
Cough, Adult  A cough is a reflex. It helps you clear your throat and airways. A cough can help heal your body. A cough can last 2 or 3 weeks (acute) or may last more than 8 weeks (chronic). Some common causes of a cough can include an infection, allergy, or a cold. HOME CARE  Only take medicine as told by your doctor.  If given, take your medicines (antibiotics) as told. Finish them even if you start to feel better.  Use a cold steam vaporizer or humidifier in your home. This can help loosen thick spit (secretions).  Sleep so you are almost sitting up (semi-upright). Use pillows to do this. This helps reduce coughing.  Rest as needed.  Stop smoking if you smoke. GET HELP RIGHT AWAY IF:  You have yellowish-white fluid (pus) in your thick spit.  Your cough gets worse.  Your medicine does not reduce coughing, and you are losing sleep.  You cough up blood.  You have trouble breathing.  Your pain gets worse and medicine does not help.  You have a fever. MAKE SURE YOU:   Understand these instructions.  Will watch your condition.  Will get help right away if you are not doing well or get worse. Document Released: 03/04/2011 Document Revised: 11/05/2013 Document Reviewed: 03/04/2011 [facility] Patient Information 2015 [place], [place]. This information is not intended to replace advice given to you by your health care provider. Make sure you discuss any questions you have with your health care provider.  Muscle Cramps and Spasms Muscle cramps and spasms are when muscles tighten by themselves. They usually get better within minutes. Muscle cramps are painful. They are usually stronger and last longer than muscle spasms. Muscle spasms may or may not be painful. They can last a few seconds or much longer. HOME CARE  Drink enough fluid to keep your pee (urine) clear or pale yellow.  Massage, stretch, and relax the muscle.  Use a warm towel, heating pad, or warm shower water on tight  muscles.  Place ice on the muscle if it is tender or in pain.  Put ice in a plastic bag.  Place a towel between your skin and the bag.  Leave the ice on for 15-20 minutes, 03-04 times a day.  Only take medicine as told by your doctor. GET HELP RIGHT AWAY IF:  Your cramps or spasms get worse, happen more often, or do not get better with time. MAKE SURE YOU:  Understand these instructions.  Will watch your condition.  Will get help right away if you are not doing well or get worse. Document Released: 06/03/2008 Document Revised: 10/16/2012 Document Reviewed: 06/07/2012 [facility] Patient Information 2015 [place], [place]. This information is not intended to replace advice given to you by your health care provider. Make sure you discuss any questions you have with your health care provider.

## 2015-02-08 NOTE — ED Notes (Signed)
Was treated last Monday for sore throat coughed up scant blood at 0300.

## 2015-02-10 NOTE — ED Provider Notes (Signed)
CSN: [id number]     Arrival date & time 02/08/15  1152 History   First MD Initiated Contact with Patient 02/08/15 1218     Chief Complaint  Patient presents with  . Sore Throat  . Neck Pain     (Consider location/radiation/quality/duration/timing/severity/associated sxs/prior Treatment) HPI   Alicia Barnett is a 39 y.o. female who presents to the Emergency Department complaining of sore throat, neck pain, and persistent cough.  symptoms have been persistent for one week.  She was seen here last week for same and denies relief from previous therapy.  She now c/o sore throat and loss of her voice.  She states her cough has been mostly non-productive, but excessive and at times, very forceful.  She denies fever, neck stiffness, fever, chills, headaches , rash and extremity numbness or weakness.  She states that she does not have a PMD.   History reviewed. No pertinent past medical history. History reviewed. No pertinent past surgical history. Family History  Problem Relation Age of Onset  . Heart failure Mother   . Cancer Other    History  Substance Use Topics  . Smoking status: Current Every Day Smoker -- 0.10 packs/day    Types: Cigarettes  . Smokeless tobacco: Never Used  . Alcohol Use: No   OB History    Gravida Para Term Preterm AB TAB SAB Ectopic Multiple Living   Review of Systems  Constitutional: Negative for fever, chills, activity change and appetite change.  HENT: Positive for congestion, sore throat and voice change. Negative for facial swelling, rhinorrhea and trouble swallowing.   Eyes: Negative for visual disturbance.  Respiratory: Positive for cough. Negative for chest tightness, shortness of breath, wheezing and stridor.   Gastrointestinal: Negative for nausea, vomiting and abdominal pain.  Musculoskeletal: Positive for neck pain. Negative for neck stiffness.  Skin: Negative.  Negative for rash.  Neurological: Negative for dizziness, weakness,  numbness and headaches.  Hematological: Negative for adenopathy.  Psychiatric/Behavioral: Negative for confusion.  All other systems reviewed and are negative.     Allergies  Codeine  Home Medications   Prior to Admission medications   Medication Sig Start Date End Date Taking? Authorizing Provider  azithromycin (ZITHROMAX) 250 MG tablet Take first 2 tablets together, then 1 every day until finished. 02/08/15   Jurline Folger, PA-C  benzonatate (TESSALON) 200 MG capsule Take 1 capsule (200 mg total) by mouth 3 (three) times daily as needed for cough. Swallow whole, do not chew 02/08/15   Quatavious Rossa, PA-C  busPIRone (BUSPAR) 10 MG tablet Take 40 mg by mouth daily.     Historical Provider, MD  cyclobenzaprine (FLEXERIL) 10 MG tablet Take 1 tablet (10 mg total) by mouth 3 (three) times daily as needed. 02/08/15   Deral Schellenberg, PA-C  FLUoxetine (PROZAC) 40 MG capsule Take 40 mg by mouth daily.    Historical Provider, MD  HYDROcodone-homatropine (HYCODAN) 5-1.5 MG/5ML syrup Take 5 mLs by mouth every 6 (six) hours as needed. 02/02/15   Ivery Quale, PA-C  loratadine-pseudoephedrine (CLARITIN-D 12 HOUR) 5-120 MG per tablet Take 1 tablet by mouth 2 (two) times daily. 02/02/15   Ivery Quale, PA-C  methocarbamol (ROBAXIN) 500 MG tablet Take 1 tablet (500 mg total) by mouth 3 (three) times daily. 02/02/15   Ivery Quale, PA-C  predniSONE (DELTASONE) 10 MG tablet 5,4,3,2,1 - take with food 02/02/15   Ivery Quale, PA-C  traZODone (DESYREL) 50  MG tablet Take 50 mg by mouth at bedtime as needed for sleep.     Historical Provider, MD   BP 120/66 mmHg  Pulse 92  Temp(Src) 97.8 F (36.6 C) (Oral)  Resp 18  Ht 5\' 3"  (1.6 m)  Wt 230 lb (104.327 kg)  BMI 40.75 kg/m2  SpO2 100%  LMP 02/08/2015 Physical Exam  Constitutional: She is oriented to person, place, and time. She appears well-developed and well-nourished. No distress.  HENT:  Head: Normocephalic and atraumatic.  Right Ear: Tympanic  membrane and ear canal normal.  Left Ear: Tympanic membrane and ear canal normal.  Mouth/Throat: Uvula is midline, oropharynx is clear and moist and mucous membranes are normal. No oropharyngeal exudate.  Eyes: EOM are normal. Pupils are equal, round, and reactive to light.  Neck: Normal range of motion and full passive range of motion without pain. Neck supple. Muscular tenderness present. No spinous process tenderness present. No tracheal deviation present. No Kernig's sign noted.  Bilateral cervical paraspinal muscle tenderness. No bony tenderness or step-offs  Cardiovascular: Normal rate, regular rhythm, normal heart sounds and intact distal pulses.   No murmur heard. Pulmonary/Chest: Effort normal and breath sounds normal. No stridor. No respiratory distress. She has no wheezes. She has no rales. She exhibits no tenderness.  Musculoskeletal: She exhibits no edema.  Lymphadenopathy:    She has no cervical adenopathy.  Neurological: She is alert and oriented to person, place, and time. She exhibits normal muscle tone. Coordination normal.  Skin: Skin is warm and dry.  Nursing note and vitals reviewed.   ED Course  Procedures (including critical care time) Labs Review Labs Reviewed - No data to display  Imaging Review No results found.   EKG Interpretation None      MDM   Final diagnoses:  Cough  Spasm of cervical paraspinous muscle    Pt recently seen here for same.  Denies relief from previous tx.    Pt is well appearing, non-toxic appearing.  Vitals stable.  Handles her secretions well.  Airway patent.  Sx's likely related to persistent cough.  No mengineal signs.  Neck pain is likely related to spasms, no spinal tenderness.    Pt agrees to arrange PMD f/u.  Appears stable for d/c    Pauline Aus, PA-C 02/10/15 1736  Vanetta Mulders, MD 02/13/15 [phone]

## 2015-03-17 ENCOUNTER — Emergency Department (HOSPITAL_COMMUNITY)
Admission: EM | Admit: 2015-03-17 | Discharge: 2015-03-17 | Disposition: A | Discharge status: Home / Self Care | Payer: Medicaid Other

## 2015-03-24 ENCOUNTER — Encounter (HOSPITAL_COMMUNITY): Payer: Self-pay | Admitting: Emergency Medicine

## 2015-03-24 ENCOUNTER — Emergency Department (HOSPITAL_COMMUNITY)
Admission: EM | Admit: 2015-03-24 | Discharge: 2015-03-24 | Disposition: A | Discharge status: Home / Self Care | Payer: Medicaid Other | Attending: Emergency Medicine | Admitting: Emergency Medicine

## 2015-03-24 DIAGNOSIS — M542 Cervicalgia: Secondary | ICD-10-CM | POA: Insufficient documentation

## 2015-03-24 DIAGNOSIS — M25511 Pain in right shoulder: Secondary | ICD-10-CM | POA: Insufficient documentation

## 2015-03-24 DIAGNOSIS — J329 Chronic sinusitis, unspecified: Secondary | ICD-10-CM

## 2015-03-24 DIAGNOSIS — Z720 Tobacco use: Secondary | ICD-10-CM | POA: Diagnosis not present

## 2015-03-24 DIAGNOSIS — J328 Other chronic sinusitis: Secondary | ICD-10-CM | POA: Diagnosis not present

## 2015-03-24 DIAGNOSIS — Z79899 Other long term (current) drug therapy: Secondary | ICD-10-CM | POA: Diagnosis not present

## 2015-03-24 DIAGNOSIS — R197 Diarrhea, unspecified: Secondary | ICD-10-CM | POA: Insufficient documentation

## 2015-03-24 DIAGNOSIS — R109 Unspecified abdominal pain: Secondary | ICD-10-CM | POA: Diagnosis not present

## 2015-03-24 DIAGNOSIS — M436 Torticollis: Secondary | ICD-10-CM | POA: Diagnosis not present

## 2015-03-24 MED ORDER — LORATADINE-PSEUDOEPHEDRINE ER 5-120 MG PO TB12: 1.0000 | ORAL_TABLET | Freq: Two times a day (BID) | ORAL | Status: DC

## 2015-03-24 MED ORDER — CYCLOBENZAPRINE HCL 10 MG PO TABS: ORAL_TABLET | ORAL | Status: DC

## 2015-03-24 MED ORDER — DEXAMETHASONE 4 MG PO TABS: 4.0000 mg | ORAL_TABLET | Freq: Two times a day (BID) | ORAL | Status: DC

## 2015-03-24 NOTE — ED Provider Notes (Signed)
CSN: [id number]     Arrival date & time 03/24/15  1316 History  This chart was scribed for non-physician practitioner, Ivery Quale, PA-C working with Linwood Dibbles, MD by Gwenyth Ober, ED scribe. This patient was seen in room APFT24/APFT24 and the patient's care was started at 2:31 PM   Chief Complaint  Patient presents with  . Neck Pain  . Sinusitis   Patient is a 39 y.o. female presenting with neck pain and sinusitis. The history is provided by the patient. No language interpreter was used.  Neck Pain Pain location:  R side Quality:  Stiffness Pain radiates to:  Does not radiate Pain severity:  Moderate Onset quality:  Gradual Timing:  Constant Chronicity:  Recurrent Worsened by:  Twisting Ineffective treatments:  NSAIDs Associated symptoms: no fever   Sinusitis Associated symptoms: sneezing   Associated symptoms: no fever     HPI Comments: Alicia Barnett is a 39 y.o. female with recurrent sinusitis who presents to the Emergency Department complaining of constant, moderate sinus pressure that started 4 days ago. Pt states sneezing, diarrhea, abdominal pain, neck stiffness and right-sided neck pain as associated symptoms. She has tried Ibuprofen with no relief. Pt is currently on Medicaid and states she cannot get into a PCP because they are not accepting new patients. She has been seen in the ED multiple times within the last six months. Pt was last seen on 8/6 with URI symptoms. She denies fever.  History reviewed. No pertinent past medical history. History reviewed. No pertinent past surgical history. Family History  Problem Relation Age of Onset  . Heart failure Mother   . Cancer Other    Social History  Substance Use Topics  . Smoking status: Current Every Day Smoker -- 0.50 packs/day    Types: Cigarettes  . Smokeless tobacco: Never Used  . Alcohol Use: No   OB History    Gravida Para Term Preterm AB TAB SAB Ectopic Multiple Living   Review of  Systems  Constitutional: Negative for fever.  HENT: Positive for sinus pressure and sneezing.   Gastrointestinal: Positive for abdominal pain and diarrhea.  Musculoskeletal: Positive for neck pain and neck stiffness.  All other systems reviewed and are negative.  Allergies  Codeine  Home Medications   Prior to Admission medications   Medication Sig Start Date End Date Taking? Authorizing Provider  azithromycin (ZITHROMAX) 250 MG tablet Take first 2 tablets together, then 1 every day until finished. 02/08/15   Tammy Triplett, PA-C  benzonatate (TESSALON) 200 MG capsule Take 1 capsule (200 mg total) by mouth 3 (three) times daily as needed for cough. Swallow whole, do not chew 02/08/15   Tammy Triplett, PA-C  busPIRone (BUSPAR) 10 MG tablet Take 40 mg by mouth daily.     Historical Provider, MD  cyclobenzaprine (FLEXERIL) 10 MG tablet Take 1 tablet (10 mg total) by mouth 3 (three) times daily as needed. 02/08/15   Tammy Triplett, PA-C  FLUoxetine (PROZAC) 40 MG capsule Take 40 mg by mouth daily.    Historical Provider, MD  HYDROcodone-homatropine (HYCODAN) 5-1.5 MG/5ML syrup Take 5 mLs by mouth every 6 (six) hours as needed. 02/02/15   Ivery Quale, PA-C  loratadine-pseudoephedrine (CLARITIN-D 12 HOUR) 5-120 MG per tablet Take 1 tablet by mouth 2 (two) times daily. 02/02/15   Ivery Quale, PA-C  methocarbamol (ROBAXIN) 500 MG tablet Take 1 tablet (500 mg total) by mouth 3 (three)  times daily. 02/02/15   Ivery Quale, PA-C  predniSONE (DELTASONE) 10 MG tablet 5,4,3,2,1 - take with food 02/02/15   Ivery Quale, PA-C  traZODone (DESYREL) 50 MG tablet Take 50 mg by mouth at bedtime as needed for sleep.     Historical Provider, MD   BP 118/78 mmHg  Pulse 97  Temp(Src) 98 F (36.7 C) (Oral)  Resp 16  Ht 5' 3.5" (1.613 m)  Wt 230 lb (104.327 kg)  BMI 40.10 kg/m2  SpO2 100%  LMP 03/17/2015 Physical Exam  Constitutional: She appears well-developed and well-nourished. No distress.  HENT:  Head:  Normocephalic and atraumatic.  Right Ear: Tympanic membrane and external ear normal.  Left Ear: Tympanic membrane and external ear normal.  Mouth/Throat: Oropharynx is clear and moist. No oropharyngeal exudate.  Nasal congestion  Eyes: Conjunctivae and EOM are normal. Pupils are equal, round, and reactive to light. Right eye exhibits no discharge. Left eye exhibits no discharge.  Neck: Neck supple. No tracheal deviation present.  Cardiovascular: Normal rate, regular rhythm and normal heart sounds.   No murmur heard. Pulmonary/Chest: Effort normal and breath sounds normal. No respiratory distress. She has no wheezes.  Musculoskeletal:  Soreness of posterior neck with ROM Tightness and tenseness of upper trapezius extending into the neck  Skin: Skin is warm and dry.  Psychiatric: She has a normal mood and affect. Her behavior is normal.  Nursing note and vitals reviewed.   ED Course  Procedures   DIAGNOSTIC STUDIES: Oxygen Saturation is 100% on RA, normal by my interpretation.    COORDINATION OF CARE: 2:36 PM Discussed treatment plan with pt. She agreed to plan.   MDM Patient has history of recurrent sinus problems. She is having problems obtaining a primary physician because of her Medicaid insurance. She presents to the emergency department with sinus related problems and sneezing. She states that she has done so much sneezing and had some any complications that it has caused her to have neck strain. The patient will be treated with Claritin-D and Decadron. The patient is also given Flexeril to take at bedtime for her neck, or 3 times daily. The patient is in agreement with this discharge plan. Final diagnoses:  None    *I have reviewed nursing notes, vital signs, and all appropriate lab and imaging results for this patient.**  *I personally performed the services described in this documentation, which was scribed in my presence. The recorded information has been reviewed and is  accurate..*   Ivery Quale, PA-C 03/24/15 1450  Linwood Dibbles, MD 03/24/15 [phone]

## 2015-03-24 NOTE — ED Notes (Signed)
Pt reports sinus pressure, sneezing and R sided neck pain. Neck pain started 4 days.

## 2015-03-24 NOTE — Discharge Instructions (Signed)
Please increase fluids. Please use Claritin-D and Decadron daily. Please use Flexeril at bedtime for your neck discomfort. May use up to 3 times daily if needed. This medication may cause drowsiness, please do not drive, drink alcohol, upper and machinery, or dissipated activities requiring concentration when taking this medication. Sinusitis Sinusitis is redness, soreness, and puffiness (inflammation) of the air pockets in the bones of your face (sinuses). The redness, soreness, and puffiness can cause air and mucus to get trapped in your sinuses. This can allow germs to grow and cause an infection.  HOME CARE   Drink enough fluids to keep your pee (urine) clear or pale yellow.  Use a humidifier in your home.  Run a hot shower to create steam in the bathroom. Sit in the bathroom with the door closed. Breathe in the steam 3-4 times a day.  Put a warm, moist washcloth on your face 3-4 times a day, or as told by your doctor.  Use salt water sprays (saline sprays) to wet the thick fluid in your nose. This can help the sinuses drain.  Only take medicine as told by your doctor. GET HELP RIGHT AWAY IF:   Your pain gets worse.  You have very bad headaches.  You are sick to your stomach (nauseous).  You throw up (vomit).  You are very sleepy (drowsy) all the time.  Your face is puffy (swollen).  Your vision changes.  You have a stiff neck.  You have trouble breathing. MAKE SURE YOU:   Understand these instructions.  Will watch your condition.  Will get help right away if you are not doing well or get worse. Document Released: 12/08/2007 Document Revised: 03/15/2012 Document Reviewed: 01/25/2012 [facility] Patient Information 2015 [place], [place]. This information is not intended to replace advice given to you by your health care provider. Make sure you discuss any questions you have with your health care provider.

## 2015-04-19 ENCOUNTER — Emergency Department (HOSPITAL_COMMUNITY)
Admission: EM | Admit: 2015-04-19 | Discharge: 2015-04-20 | Disposition: A | Discharge status: Home / Self Care | Payer: Medicaid Other | Attending: Emergency Medicine | Admitting: Emergency Medicine

## 2015-04-19 ENCOUNTER — Emergency Department (HOSPITAL_COMMUNITY): Payer: Medicaid Other

## 2015-04-19 ENCOUNTER — Encounter (HOSPITAL_COMMUNITY): Payer: Self-pay | Admitting: *Deleted

## 2015-04-19 DIAGNOSIS — M722 Plantar fascial fibromatosis: Secondary | ICD-10-CM | POA: Diagnosis not present

## 2015-04-19 DIAGNOSIS — Z720 Tobacco use: Secondary | ICD-10-CM | POA: Diagnosis not present

## 2015-04-19 DIAGNOSIS — Z79899 Other long term (current) drug therapy: Secondary | ICD-10-CM | POA: Diagnosis not present

## 2015-04-19 DIAGNOSIS — M79671 Pain in right foot: Secondary | ICD-10-CM | POA: Diagnosis present

## 2015-04-19 MED ORDER — HYDROCODONE-ACETAMINOPHEN 5-325 MG PO TABS
1.0000 | ORAL_TABLET | Freq: Once | ORAL | Status: AC
  Administered 2015-04-19: 1 via ORAL
  Filled 2015-04-19: qty 1

## 2015-04-19 NOTE — ED Provider Notes (Signed)
CSN: [id number]     Arrival date & time 04/19/15  2132 History   First MD Initiated Contact with Patient 04/19/15 2151     Chief Complaint  Patient presents with  . Foot Pain     (Consider location/radiation/quality/duration/timing/severity/associated sxs/prior Treatment) HPI   Alicia Barnett is a 39 y.o. female who presents to the Emergency Department complaining of right foot pain for three days.  She states that she had similar symptoms previously but pain improved and began hurting again.  She describes a sharp, piercing pain to the bottom of her foot near the heel that is worse with weight bearing.  She denies known injury, redness, swelling, numbness, weakness or pain radiating into her leg.  She tried taking left over flexeril without relief.  She also stats that when she stood up this morning the pain to her foot was so severe that it caused her to fall which made her foot pain worsen    History reviewed. No pertinent past medical history. History reviewed. No pertinent past surgical history. Family History  Problem Relation Age of Onset  . Heart failure Mother   . Cancer Other    Social History  Substance Use Topics  . Smoking status: Current Every Day Smoker -- 0.50 packs/day    Types: Cigarettes  . Smokeless tobacco: Never Used  . Alcohol Use: No   OB History    Gravida Para Term Preterm AB TAB SAB Ectopic Multiple Living   Review of Systems  Constitutional: Negative for fever and chills.  Musculoskeletal: Positive for arthralgias (right foot pain). Negative for joint swelling.  Skin: Negative for color change and wound.  Neurological: Negative for weakness and numbness.  All other systems reviewed and are negative.     Allergies  Codeine  Home Medications   Prior to Admission medications   Medication Sig Start Date End Date Taking? Authorizing Provider  ibuprofen (ADVIL,MOTRIN) 200 MG tablet Take 600 mg by mouth every 6 (six) hours as  needed for mild pain or moderate pain.   Yes Historical Provider, MD  azithromycin (ZITHROMAX) 250 MG tablet Take first 2 tablets together, then 1 every day until finished. Patient not taking: Reported on 03/24/2015 02/08/15   Kalee Broxton, PA-C  benzonatate (TESSALON) 200 MG capsule Take 1 capsule (200 mg total) by mouth 3 (three) times daily as needed for cough. Swallow whole, do not chew Patient not taking: Reported on 03/24/2015 02/08/15   Parks Czajkowski, PA-C  busPIRone (BUSPAR) 10 MG tablet Take 40 mg by mouth daily.     Historical Provider, MD  cyclobenzaprine (FLEXERIL) 10 MG tablet 1 at hs for neck pain, or tid for neck spasm. Patient not taking: Reported on 04/19/2015 03/24/15   Ivery Quale, PA-C  dexamethasone (DECADRON) 4 MG tablet Take 1 tablet (4 mg total) by mouth 2 (two) times daily with a meal. Patient not taking: Reported on 04/19/2015 03/24/15   Ivery Quale, PA-C  FLUoxetine (PROZAC) 40 MG capsule Take 40 mg by mouth daily.    Historical Provider, MD  HYDROcodone-homatropine (HYCODAN) 5-1.5 MG/5ML syrup Take 5 mLs by mouth every 6 (six) hours as needed. Patient not taking: Reported on 03/24/2015 02/02/15   Ivery Quale, PA-C  loratadine-pseudoephedrine (CLARITIN-D 12 HOUR) 5-120 MG per tablet Take 1 tablet by mouth 2 (two) times daily. Patient not taking: Reported on 04/19/2015 03/24/15   Ivery Quale, PA-C  methocarbamol (ROBAXIN) 500 MG  tablet Take 1 tablet (500 mg total) by mouth 3 (three) times daily. Patient not taking: Reported on 03/24/2015 02/02/15   Ivery QualeHobson Bryant, PA-C  predniSONE (DELTASONE) 10 MG tablet 9,6,0,4,55,4,3,2,1 - take with food Patient not taking: Reported on 03/24/2015 02/02/15   Ivery QualeHobson Bryant, PA-C  traZODone (DESYREL) 50 MG tablet Take 50 mg by mouth at bedtime as needed for sleep.     Historical Provider, MD   BP 137/81 mmHg  Pulse 100  Temp(Src) 98 F (36.7 C) (Oral)  Resp 14  Ht 5\' 3"  (1.6 m)  Wt 230 lb (104.327 kg)  BMI 40.75 kg/m2  SpO2 100%  LMP  03/17/2015 Physical Exam  Constitutional: She is oriented to person, place, and time. She appears well-developed and well-nourished. No distress.  HENT:  Head: Normocephalic and atraumatic.  Cardiovascular: Normal rate, regular rhythm and intact distal pulses.   Pulmonary/Chest: Effort normal and breath sounds normal.  Musculoskeletal: She exhibits tenderness. She exhibits no edema.  Localized ttp of the plantar surface of the right foot over the calcaneus.  DP pulse is brisk,distal sensation intact. Distal foot is NT.  No erythema, abrasion, open wounds or bony deformity.  No proximal tenderness or edema.    Neurological: She is alert and oriented to person, place, and time. She exhibits normal muscle tone. Coordination normal.  Skin: Skin is warm and dry.  Nursing note and vitals reviewed.   ED Course  Procedures (including critical care time) Labs Review Labs Reviewed - No data to display  Imaging Review Dg Foot Complete Right  04/19/2015  CLINICAL DATA:  Right heel and arch pain since 3 days but worse this morning. No known injury. EXAM: RIGHT FOOT COMPLETE - 3+ VIEW COMPARISON:  Right ankle 12/18/2014 FINDINGS: There is no evidence of fracture or dislocation. There is no evidence of arthropathy or other focal bone abnormality. Soft tissues are unremarkable. IMPRESSION: Negative. Electronically Signed   By: Burman NievesWilliam  Stevens M.D.   On: 04/19/2015 23:07   I have personally reviewed and evaluated these images and lab results as part of my medical decision-making.   EKG Interpretation None      MDM   Final diagnoses:  Plantar fasciitis of right foot    XR reviewed with the patient.  Exam findings c/w plantar fasciitis of the foot.  NV intact.  No concerning sx's for infectious process.  Pt agrees to symptomatic tx and close podiatry f/u.  She appears stable for d/c       Alicia Ausammy Prinston Kynard, PA-C 04/20/15 0022  Alicia Davidoff, PA-C 04/20/15 1224  Samuel JesterKathleen McManus,  DO 04/23/15 2105

## 2015-04-19 NOTE — ED Notes (Signed)
Pt states right heel & arch pain worse this morning. Pain started about 3 days ago. Denies any injury.

## 2015-04-20 MED ORDER — HYDROCODONE-ACETAMINOPHEN 5-325 MG PO TABS
ORAL_TABLET | ORAL | Status: DC
Start: 2015-04-20 — End: 2015-06-16

## 2015-04-20 MED ORDER — NAPROXEN 500 MG PO TABS: 500.0000 mg | ORAL_TABLET | Freq: Two times a day (BID) | ORAL | Status: DC

## 2015-04-20 NOTE — Discharge Instructions (Signed)
Plantar Fasciitis Plantar fasciitis is a painful foot condition that affects the heel. It occurs when the band of tissue that connects the toes to the heel bone (plantar fascia) becomes irritated. This can happen after exercising too much or doing other repetitive activities (overuse injury). The pain from plantar fasciitis can range from mild irritation to severe pain that makes it difficult for you to walk or move. The pain is usually worse in the morning or after you have been sitting or lying down for a while. CAUSES This condition may be caused by:  Standing for long periods of time.  Wearing shoes that do not fit.  Doing high-impact activities, including running, aerobics, and ballet.  Being overweight.  Having an abnormal way of walking (gait).  Having tight calf muscles.  Having high arches in your feet.  Starting a new athletic activity. SYMPTOMS The main symptom of this condition is heel pain. Other symptoms include:  Pain that gets worse after activity or exercise.  Pain that is worse in the morning or after resting.  Pain that goes away after you walk for a few minutes. DIAGNOSIS This condition may be diagnosed based on your signs and symptoms. Your health care provider will also do a physical exam to check for:  A tender area on the bottom of your foot.  A high arch in your foot.  Pain when you move your foot.  Difficulty moving your foot. You may also need to have imaging studies to confirm the diagnosis. These can include:  X-rays.  Ultrasound.  MRI. TREATMENT  Treatment for plantar fasciitis depends on the severity of the condition. Your treatment may include:  Rest, ice, and over-the-counter pain medicines to manage your pain.  Exercises to stretch your calves and your plantar fascia.  A splint that holds your foot in a stretched, upward position while you sleep (night splint).  Physical therapy to relieve symptoms and prevent problems in the  future.  Cortisone injections to relieve severe pain.  Extracorporeal shock wave therapy (ESWT) to stimulate damaged plantar fascia with electrical impulses. It is often used as a last resort before surgery.  Surgery, if other treatments have not worked after 12 months. HOME CARE INSTRUCTIONS  Take medicines only as directed by your health care provider.  Avoid activities that cause pain.  Roll the bottom of your foot over a bag of ice or a bottle of cold water. Do this for 20 minutes, 3-4 times a day.  Perform simple stretches as directed by your health care provider.  Try wearing athletic shoes with air-sole or gel-sole cushions or soft shoe inserts.  Wear a night splint while sleeping, if directed by your health care provider.  Keep all follow-up appointments with your health care provider. PREVENTION   Do not perform exercises or activities that cause heel pain.  Consider finding low-impact activities if you continue to have problems.  Lose weight if you need to. The best way to prevent plantar fasciitis is to avoid the activities that aggravate your plantar fascia. SEEK MEDICAL CARE IF:  Your symptoms do not go away after treatment with home care measures.  Your pain gets worse.  Your pain affects your ability to move or do your daily activities.   This information is not intended to replace advice given to you by your health care provider. Make sure you discuss any questions you have with your health care provider.   Document Released: 03/16/2001 Document Revised: 03/12/2015 Document Reviewed: 05/01/2014 Elsevier   Interactive Patient Education 2016 [employer]  

## 2015-04-21 MED FILL — Hydrocodone-Acetaminophen Tab 5-325 MG: ORAL | Qty: 6 | Status: AC

## 2015-05-05 ENCOUNTER — Emergency Department (HOSPITAL_COMMUNITY)
Admission: EM | Admit: 2015-05-05 | Discharge: 2015-05-05 | Disposition: A | Discharge status: Home / Self Care | Payer: Medicaid Other | Attending: Emergency Medicine | Admitting: Emergency Medicine

## 2015-05-05 ENCOUNTER — Encounter (HOSPITAL_COMMUNITY): Payer: Self-pay | Admitting: Emergency Medicine

## 2015-05-05 DIAGNOSIS — F1310 Sedative, hypnotic or anxiolytic abuse, uncomplicated: Secondary | ICD-10-CM | POA: Diagnosis not present

## 2015-05-05 DIAGNOSIS — Z008 Encounter for other general examination: Secondary | ICD-10-CM | POA: Diagnosis present

## 2015-05-05 DIAGNOSIS — F1210 Cannabis abuse, uncomplicated: Secondary | ICD-10-CM | POA: Diagnosis not present

## 2015-05-05 DIAGNOSIS — Z720 Tobacco use: Secondary | ICD-10-CM | POA: Insufficient documentation

## 2015-05-05 DIAGNOSIS — Z79899 Other long term (current) drug therapy: Secondary | ICD-10-CM | POA: Diagnosis not present

## 2015-05-05 DIAGNOSIS — F1910 Other psychoactive substance abuse, uncomplicated: Secondary | ICD-10-CM

## 2015-05-05 DIAGNOSIS — F1110 Opioid abuse, uncomplicated: Secondary | ICD-10-CM | POA: Diagnosis not present

## 2015-05-05 DIAGNOSIS — Z331 Pregnant state, incidental: Secondary | ICD-10-CM | POA: Insufficient documentation

## 2015-05-05 HISTORY — DX: Other psychoactive substance abuse, uncomplicated: F19.10

## 2015-05-05 LAB — COMPREHENSIVE METABOLIC PANEL
ALBUMIN: 4.1 g/dL (ref 3.5–5.0)
ALT: 20 U/L (ref 14–54)
ANION GAP: 8 (ref 5–15)
AST: 21 U/L (ref 15–41)
Alkaline Phosphatase: 52 U/L (ref 38–126)
BUN: 11 mg/dL (ref 6–20)
CHLORIDE: 104 mmol/L (ref 101–111)
CO2: 23 mmol/L (ref 22–32)
Calcium: 8.9 mg/dL (ref 8.9–10.3)
Creatinine, Ser: 0.71 mg/dL (ref 0.44–1.00)
GFR calc Af Amer: >60 mL/min (ref >60)
GFR calc non Af Amer: >60 mL/min (ref >60)
GLUCOSE: 91 mg/dL (ref 65–99)
POTASSIUM: 3.4 mmol/L — AB (ref 3.5–5.1)
SODIUM: 135 mmol/L (ref 135–145)
Total Bilirubin: 0.6 mg/dL (ref 0.3–1.2)
Total Protein: 7.2 g/dL (ref 6.5–8.1)

## 2015-05-05 LAB — CBC
HEMATOCRIT: 37.5 % (ref 36.0–46.0)
HEMOGLOBIN: 12.7 g/dL (ref 12.0–15.0)
MCH: 30.9 pg (ref 26.0–34.0)
MCHC: 33.9 g/dL (ref 30.0–36.0)
MCV: 91.2 fL (ref 78.0–100.0)
Platelets: 321 10*3/uL (ref 150–400)
RBC: 4.11 MIL/uL (ref 3.87–5.11)
RDW: 14.6 % (ref 11.5–15.5)
WBC: 9.7 10*3/uL (ref 4.0–10.5)

## 2015-05-05 LAB — RAPID URINE DRUG SCREEN, HOSP PERFORMED
AMPHETAMINES: NOT DETECTED
BARBITURATES: NOT DETECTED
Benzodiazepines: POSITIVE — AB
Cocaine: NOT DETECTED
OPIATES: POSITIVE — AB
TETRAHYDROCANNABINOL: POSITIVE — AB

## 2015-05-05 LAB — SALICYLATE LEVEL

## 2015-05-05 LAB — ETHANOL: Alcohol, Ethyl (B): <5 mg/dL (ref <5)

## 2015-05-05 LAB — PREGNANCY, URINE: Preg Test, Ur: POSITIVE — AB

## 2015-05-05 LAB — ACETAMINOPHEN LEVEL

## 2015-05-05 MED ORDER — PRENATAL COMPLETE 14-0.4 MG PO TABS: 1.0000 | ORAL_TABLET | Freq: Every day | ORAL | Status: DC

## 2015-05-05 NOTE — ED Notes (Signed)
Detox from Xanax, Hydrocodone and oxycodone.  Last time took Vicodin at 0530 this am.  From 7pm to 7am, pt says she has taken 7 Vicodin tablets while at work.

## 2015-05-05 NOTE — ED Provider Notes (Signed)
CSN: [id number]     Arrival date & time 05/05/15  7829 History   First MD Initiated Contact with Patient 05/05/15 [phone]     Chief Complaint  Patient presents with  . Medical Clearance      HPI Pt was seen at 0925. Per pt, c/o gradual onset and persistence of constant polysubstance abuse for the past 6+ months. Pt states she has been abusing xanax, hydrocodone, and oxycodone. Pt states she has been evaluated by [facility] and is due to begin an suboxone program.  Pt was sent to the ED for medical clearance labs. Pt currently denies any complaints. Denies SI, no HI, no hallucinations. Denies CP/SOB, no abd pain, no N/V/D.    Past Medical History  Diagnosis Date  . Polysubstance abuse      History reviewed. No pertinent past surgical history.   Family History  Problem Relation Age of Onset  . Heart failure Mother   . Cancer Other    Social History  Substance Use Topics  . Smoking status: Current Every Day Smoker -- 0.50 packs/day    Types: Cigarettes  . Smokeless tobacco: Never Used  . Alcohol Use: No   OB History    Gravida Para Term Preterm AB TAB SAB Ectopic Multiple Living   Review of Systems ROS: Statement: All systems negative except as marked or noted in the HPI; Constitutional: Negative for fever and chills. ; ; Eyes: Negative for eye pain, redness and discharge. ; ; ENMT: Negative for ear pain, hoarseness, nasal congestion, sinus pressure and sore throat. ; ; Cardiovascular: Negative for chest pain, palpitations, diaphoresis, dyspnea and peripheral edema. ; ; Respiratory: Negative for cough, wheezing and stridor. ; ; Gastrointestinal: Negative for nausea, vomiting, diarrhea, abdominal pain, blood in stool, hematemesis, jaundice and rectal bleeding. . ; ; Genitourinary: Negative for dysuria, flank pain and hematuria. ; ; Musculoskeletal: Negative for back pain and neck pain. Negative for swelling and trauma.; ; Skin: Negative for pruritus,  rash, abrasions, blisters, bruising and skin lesion.; ; Neuro: Negative for headache, lightheadedness and neck stiffness. Negative for weakness, altered level of consciousness , altered mental status, extremity weakness, paresthesias, involuntary movement, seizure and syncope.; Psych:  No SI, no SA, no HI, no hallucinations.      Allergies  Codeine  Home Medications   Prior to Admission medications   Medication Sig Start Date End Date Taking? Authorizing Provider  sertraline (ZOLOFT) 50 MG tablet Take 150 mg by mouth daily.   Yes Historical Provider, MD  traZODone (DESYREL) 50 MG tablet Take 50 mg by mouth at bedtime as needed for sleep.    Yes Historical Provider, MD  azithromycin (ZITHROMAX) 250 MG tablet Take first 2 tablets together, then 1 every day until finished. Patient not taking: Reported on 03/24/2015 02/08/15   Tammy Triplett, PA-C  benzonatate (TESSALON) 200 MG capsule Take 1 capsule (200 mg total) by mouth 3 (three) times daily as needed for cough. Swallow whole, do not chew Patient not taking: Reported on 03/24/2015 02/08/15   Tammy Triplett, PA-C  cyclobenzaprine (FLEXERIL) 10 MG tablet 1 at hs for neck pain, or tid for neck spasm. Patient not taking: Reported on 04/19/2015 03/24/15   Ivery Quale, PA-C  dexamethasone (DECADRON) 4 MG tablet Take 1 tablet (4 mg total) by mouth 2 (two) times daily with a meal. Patient not taking: Reported on 04/19/2015 03/24/15   Ivery Quale, PA-C  HYDROcodone-acetaminophen (NORCO/VICODIN) 5-325 MG tablet Take one-two tabs po q 4-6 hrs prn pain Patient not taking: Reported on 05/05/2015 04/20/15   Tammy Triplett, PA-C  HYDROcodone-acetaminophen (NORCO/VICODIN) 5-325 MG tablet Take one-two tabs po q 4-6 hrs prn pain Patient not taking: Reported on 05/05/2015 04/20/15   Tammy Triplett, PA-C  HYDROcodone-homatropine (HYCODAN) 5-1.5 MG/5ML syrup Take 5 mLs by mouth every 6 (six) hours as needed. Patient not taking: Reported on 03/24/2015 02/02/15    Ivery QualeHobson Bryant, PA-C  loratadine-pseudoephedrine (CLARITIN-D 12 HOUR) 5-120 MG per tablet Take 1 tablet by mouth 2 (two) times daily. Patient not taking: Reported on 04/19/2015 03/24/15   Ivery QualeHobson Bryant, PA-C  methocarbamol (ROBAXIN) 500 MG tablet Take 1 tablet (500 mg total) by mouth 3 (three) times daily. Patient not taking: Reported on 03/24/2015 02/02/15   Ivery QualeHobson Bryant, PA-C  naproxen (NAPROSYN) 500 MG tablet Take 1 tablet (500 mg total) by mouth 2 (two) times daily with a meal. Patient not taking: Reported on 05/05/2015 04/20/15   Tammy Triplett, PA-C  predniSONE (DELTASONE) 10 MG tablet 5,4,3,2,1 - take with food Patient not taking: Reported on 03/24/2015 02/02/15   Ivery QualeHobson Bryant, PA-C   BP 126/79 mmHg  Pulse 72  Temp(Src) 98 F (36.7 C) (Oral)  Resp 16  Ht 5\' 3"  (1.6 m)  Wt 230 lb (104.327 kg)  BMI 40.75 kg/m2  SpO2 100%  LMP 04/17/2015 Physical Exam  0930: Physical examination:  Nursing notes reviewed; Vital signs and O2 SAT reviewed;  Constitutional: Well developed, Well nourished, Well hydrated, In no acute distress; Head:  Normocephalic, atraumatic; Eyes: EOMI, PERRL, No scleral icterus; ENMT: Mouth and pharynx normal, Mucous membranes moist; Neck: Supple, Full range of motion, No lymphadenopathy; Cardiovascular: Regular rate and rhythm, No murmur, rub, or gallop; Respiratory: Breath sounds clear & equal bilaterally, No rales, rhonchi, wheezes.  Speaking full sentences with ease, Normal respiratory effort/excursion; Chest: Nontender, Movement normal; Abdomen: Soft, Nontender, Nondistended, Normal bowel sounds; Genitourinary: No CVA tenderness; Extremities: Pulses normal, No tremor. No tenderness, No edema, No calf edema or asymmetry.; Neuro: AA&Ox3, Major CN grossly intact.  Speech clear. No gross focal motor or sensory deficits in extremities.; Skin: Color normal, Warm, Dry.; Psych:  Denies SI, no HI, no hallucinations.     ED Course  Procedures (including critical care time) Labs  Review   Imaging Review  I have personally reviewed and evaluated these images and lab results as part of my medical decision-making.   EKG Interpretation None      MDM  MDM Reviewed: previous chart, nursing note and vitals Reviewed previous: labs Interpretation: labs     Results for orders placed or performed during the hospital encounter of 05/05/15  Comprehensive metabolic panel  Result Value Ref Range   Sodium 135 135 - 145 mmol/L   Potassium 3.4 (L) 3.5 - 5.1 mmol/L   Chloride 104 101 - 111 mmol/L   CO2 23 22 - 32 mmol/L   Glucose, Bld 91 65 - 99 mg/dL   BUN 11 6 - 20 mg/dL   Creatinine, Ser 1.610.71 0.44 - 1.00 mg/dL   Calcium 8.9 8.9 - 09.610.3 mg/dL   Total Protein 7.2 6.5 - 8.1 g/dL   Albumin 4.1 3.5 - 5.0 g/dL   AST 21 15 - 41 U/L   ALT 20 14 - 54 U/L   Alkaline Phosphatase 52 38 - 126 U/L   Total Bilirubin 0.6 0.3 - 1.2 mg/dL   GFR calc non Af Amer >60 >60 mL/min  GFR calc Af Amer >60 >60 mL/min   Anion gap 8 5 - 15  Ethanol (ETOH)  Result Value Ref Range   Alcohol, Ethyl (B) <5 <5 mg/dL  CBC  Result Value Ref Range   WBC 9.7 4.0 - 10.5 K/uL   RBC 4.11 3.87 - 5.11 MIL/uL   Hemoglobin 12.7 12.0 - 15.0 g/dL   HCT 16.1 09.6 - 04.5 %   MCV 91.2 78.0 - 100.0 fL   MCH 30.9 26.0 - 34.0 pg   MCHC 33.9 30.0 - 36.0 g/dL   RDW 40.9 81.1 - 91.4 %   Platelets 321 150 - 400 K/uL  Urine rapid drug screen (hosp performed) (Not at [facility])  Result Value Ref Range   Opiates POSITIVE (A) NONE DETECTED   Cocaine NONE DETECTED NONE DETECTED   Benzodiazepines POSITIVE (A) NONE DETECTED   Amphetamines NONE DETECTED NONE DETECTED   Tetrahydrocannabinol POSITIVE (A) NONE DETECTED   Barbiturates NONE DETECTED NONE DETECTED  Acetaminophen level  Result Value Ref Range   Acetaminophen (Tylenol), Serum <10 (L) 10 - 30 ug/mL  Salicylate level  Result Value Ref Range   Salicylate Lvl <4.0 2.8 - 30.0 mg/dL  Pregnancy, urine  Result Value Ref Range   Preg Test, Ur POSITIVE (A)  NEGATIVE    1100:  No s/s of withdrawal; COWS score 0. Pt aware of pregnancy; denies pelvic/abd pain and exam remains benign. Dx and testing d/w pt.  Questions answered.  Verb understanding, agreeable to d/c home with outpt f/u.   Samuel Jester, DO 05/07/15 1501

## 2015-05-05 NOTE — Discharge Instructions (Signed)
°Emergency Department Resource Guide °1) Find a Doctor and Pay Out of Pocket °Although you won't have to find out who is covered by your insurance plan, it is a good idea to ask around and get recommendations. You will then need to call the office and see if the doctor you have chosen will accept you as a new patient and what types of options they offer for patients who are self-pay. Some doctors offer discounts or will set up payment plans for their patients who do not have insurance, but you will need to ask so you aren't surprised when you get to your appointment. ° °2) Contact Your Local Health Department °Not all health departments have doctors that can see patients for sick visits, but many do, so it is worth a call to see if yours does. If you don't know where your local health department is, you can check in your phone book. The CDC also has a tool to help you locate your state's health department, and many state websites also have listings of all of their local health departments. ° °3) Find a Walk-in Clinic °If your illness is not likely to be very severe or complicated, you may want to try a walk in clinic. These are popping up all over the country in pharmacies, drugstores, and shopping centers. They're usually staffed by nurse practitioners or physician assistants that have been trained to treat common illnesses and complaints. They're usually fairly quick and inexpensive. However, if you have serious medical issues or chronic medical problems, these are probably not your best option. ° °No Primary Care Doctor: °- Call [facility] Connect at  832-8000 - they can help you locate a primary care doctor that  accepts your insurance, provides certain services, etc. °- Physician Referral Service- 1-800-533-3463 ° °Chronic Pain Problems: °Organization         Address  Phone   Notes  °[facility]  (336) 297-2271 Patients need to be referred by their primary care doctor.  ° °Medication  Assistance: °Organization         Address  Phone   Notes  °[place] [facility] [address] °[place], [place] [address] (336) 641-8030 --Must be a resident of [place] °-- Must have NO insurance coverage whatsoever (no Medicaid/ Medicare, etc.) °-- The pt. MUST have a primary care doctor that directs their care regularly and follows them in the community °  °[facility]  (866) 331-1348   °[employer]  (888) 892-1162   ° °Agencies that provide inexpensive medical care: °Organization         Address  Phone   Notes  °[facility]  (336) 832-8035   °[facility]    (336) 832-7272   °[facility] [address] °[place], [place] [address] (336) 832-4777   °[facility] [address], °[place] (336) 271-4999   °[facility]    (336) 373-0678   °[facility]    (336) 272-1050   °[facility] ° [address], [place] Phone:  (336) 832-4444, Fax:  (336) 832-4440 Hours of Operation:  9 am - 6 pm, M-F.  Also accepts Medicaid/Medicare and self-pay.  °[facility] ° [address], [place] Phone: (336) 832-3150, Fax: (336) [phone]. Hours of Operation:  8:30 am - 5:30 pm, M-F.  Also accepts Medicaid and self-pay.  °[facility] [place] [address], [place] Phone: (336) 878-6027   °[facility] [address], [place], [place] (336)723-1848, Ext. 123 Mondays & Thursdays: 7-9 AM.  First 15 patients are seen on a first come, first serve basis. °  ° °Medicaid-accepting [place] Providers: ° °Organization         Address  Phone   Notes  °[facility] [address], [place] (336) 641-2100 Also accepts self-pay patients.  °[facility] [address], [place] ° (336) 856-9996   °[facility] [address], [place]  (336) 288-8857   °[facility] [address] [place] [address], [place] (336) 299-7000   °Veita Bland [address], [place]  ° (336) 373-1557 Only accepts [place] Access Medicaid patients after they have their name applied to their card.  ° °Self-Pay (no insurance) in [place]: ° °Organization         Address  Phone   Notes  °Sickle Cell Patients, [facility] [address], [place] (336) 832-1970   °[facility] [address], [place] (336) 832-4400   °[facility] ° [address], [place] (336) 992-4800   °[facility]/Dr. Osei-Bonsu ° [address] [place] [address], [place] or [address], [place] (336) 841-8500 Phone number for both [place] and [place] locations is the same.  °[facility] [address], [place] (336) 299-0000   °[facility] [address] [place] [address], [place] or [address] (336) 852-7530 °(336) 878-2260   °[facility] [address], [place] (336) 350-1642, phone; (336) 294-5005, fax Sees patients 1st and 3rd Saturday of every month.  Must not qualify for public or private insurance (i.e. Medicaid, Medicare, [place] Health Choice, Veterans' Benefits) • Household income should be no more than 200% of the poverty level •The clinic cannot treat you if you are pregnant or think you are pregnant • Sexually transmitted diseases are not treated at the clinic.  ° ° °Dental Care: °Organization         Address  Phone  Notes  °[place] [facility] [address], [place] (336) 641-6152 Accepts children up to age 21 who are enrolled in Medicaid or [place] Health Choice; pregnant women with a Medicaid card; and children who have applied for Medicaid or [place] Health Choice, but were declined, whose parents can pay a reduced fee at time of service.  °[place]  [facility] [place]  [address], [place] (336) 641-7733 Accepts children up to age 21 who are enrolled in Medicaid or [place] Health Choice; pregnant women with a Medicaid card; and children who have applied for Medicaid or [place] Health Choice, but were declined, whose parents can pay a reduced fee at time of service.  °[facility] ° [address], [place] (336) 641-4533 Patients are seen by appointment only. Walk-ins are not accepted. [facility] will see patients 18 years of age and older. °Monday - Tuesday (8am-5pm) °Most Wednesdays (8:30-5pm) °$30 per visit, cash only  °[facility] ° [address], [place] (336) 641-4533 Patients are seen by appointment only. Walk-ins are not accepted. [facility] will see patients 18 years of age and older. °One   Wednesday Evening (Monthly: Volunteer Based).  $30 per visit, cash only  °[facility]  (919) 537-3737 for adults; Children under age 4, call Graduate Pediatric Dentistry at ([phone]) [phone]. Children aged 4-14, please call (919) [phone] to request a pediatric application. ° Dental services are provided in all areas of dental care including fillings, crowns and bridges, complete and partial dentures, implants, gum treatment, root canals, and extractions. Preventive care is also provided. Treatment is provided to both adults and children. °Patients are selected via a lottery and there is often a waiting list. °  °[facility] [address], °[place] ° (336) 763-8833 [email or web address] °  °[facility] [address], [place], [place] (336)723-1848, Ext. 123 Second and Fourth Thursday of each month, opens at 6:30 AM; Clinic ends at 9 AM.  Patients are seen on a first-come first-served basis, and a limited number are seen during each clinic.  ° °[facility] ° [address], [place], [place] (336) 723-7904    Eligibility Requirements °You must have lived in [place], [place], or [place] counties for at least the last three months. °  You cannot be eligible for state or federal sponsored healthcare insurance, including Veterans Administration, Medicaid, or Medicare. °  You generally cannot be eligible for healthcare insurance through your employer.  °  How to apply: °Eligibility screenings are held every Tuesday and Wednesday afternoon from 1:00 pm until 4:00 pm. You do not need an appointment for the interview!  °[facility] [address], [place], [place] 336-631-2330   °[facility]  336-342-8273   °[facility]  336-703-3100   °[facility]  336-570-6415   ° °Behavioral Health Resources in the Community: °Intensive Outpatient Programs °Organization         Address  Phone  Notes  °[facility] [address], [place], [place] 336-878-6098   °[facility] [address], [place], [place] 336-832-9800   °[facility] [address], [place], [place] ° 336-882-2125   °[facility] [address],  °[place], [place] 1-800-853-5163 or 336-641-4981   °Substance Abuse Resources °Organization         Address  Phone  Notes  °[facility]  336-882-2125   °[facility]  336-784-9470   °[facility]  336-285-9073   °[facility]  336-845-3988   °Residential & Outpatient Substance Abuse Program  1-800-659-3381   °Psychological Services °Organization         Address  Phone  Notes  °[facility]  336- 832-9600   °[facility]  336- 378-7881   °[facility] [address], [place] 1-800-853-5163 or 336-641-4981   ° °Mobile Crisis Teams °Organization         Address  Phone  Notes  °[facility], Mobile Crisis Care Unit  1-877-626-1772   °Assertive °[facility] ° [address]  [place], [place] 336-834-9664   °Sharon DeEsch [address] °[place] 336-554-5454   ° °Self-Help/Support Groups °Organization         Address  Phone             Notes  °[facility]  - variety of support groups  336- 373-1402 Call for more information  °Narcotics Anonymous (NA), [facility] [address], °[place]  2 meetings at this location  ° °  Residential Treatment Programs Organization         Address  Phone  Notes  [facility] [address],    [place]  [phone]   [facility]  [address], [place] [phone], [place], [place] [phone]   [facility] [address], [place] [phone] Admissions: 8am-3pm M-F  [facility] [address]    [place], [place] [phone]   [facility] [address], [place], [place] [phone]   [facility] [address]  [place], [place] [phone]   [facility] Intensive Outpatient [address], [place], [place] [phone]   [facility] [address]  [place], [place] [phone] or (580) [phone]   [facility] [address] [place], [place] [phone] Accepts Medicaid  [facility] [address]  [place] [phone] Substance Abuse/Addiction Treatment   [facility]         Address  Phone  Notes  [facility]  973-389-4863(888) [phone]   Angie FavaJulie Brannon, PhD [address], [address] [place], [place]   ([phone](336) (908) [phone] or [phone](336) [phone]   [facility] [place]   [address] [place], [place] ([phone]) [phone](336) [phone]   [facility] [address], [place], [place] 330-252-6222(336) [phone] Insurance/Medicaid/sponsorship through [facility] [address]                                    [place], [place] ([phone](336) [phone] Therapy/tele-psych/case    [facility] [address]   [place], [place] [phone](336) [phone]    Dr. Lolly MustacheArfeen  ([phone]) [phone](336) [phone]   [facility]  [employer] [facility] 1) [address], [place] 2) [address], [place] 3)  [address], [place] [phone](336) [phone](336) [phone]  [phone](336) [phone]   [facility] Child Abuse Hot[facility] 709-266-4444(336) (202)512-5584 or [phone](336) [phone] (After Hours)      Take the prescription as directed.  Call the OB/GYN doctor today to schedule a follow up appointment within the next week. Call the outpatient resources given to you today to assist with your detox.  Return to the Emergency Department immediately sooner if worsening.

## 2015-05-05 NOTE — ED Notes (Signed)
Pt states that she is being seen by [facility] on outpatient basis.  States that she is scheduled to begin Suboxone program but must be medically clear due to taking Xanax per her doctor.  States that she takes about 3mg /day but not everyday.  Has been doing so for about 6 months.  Last used for past 5 days but had not used any for 2 weeks prior to that.

## 2015-06-05 ENCOUNTER — Other Ambulatory Visit: Payer: Self-pay | Admitting: Obstetrics & Gynecology

## 2015-06-05 DIAGNOSIS — O3680X0 Pregnancy with inconclusive fetal viability, not applicable or unspecified: Secondary | ICD-10-CM

## 2015-06-06 ENCOUNTER — Ambulatory Visit (INDEPENDENT_AMBULATORY_CARE_PROVIDER_SITE_OTHER): Payer: Medicaid Other

## 2015-06-06 DIAGNOSIS — O3680X0 Pregnancy with inconclusive fetal viability, not applicable or unspecified: Secondary | ICD-10-CM

## 2015-06-06 DIAGNOSIS — Z3A09 9 weeks gestation of pregnancy: Secondary | ICD-10-CM

## 2015-06-06 NOTE — Progress Notes (Signed)
US 9+4 wks,single IUP,pos fht 167 bpm,normal rt ov,unable to visualize lt ov,crl 27.658mm

## 2015-06-16 ENCOUNTER — Encounter: Payer: Self-pay | Admitting: Obstetrics and Gynecology

## 2015-06-16 ENCOUNTER — Telehealth: Payer: Self-pay | Admitting: Obstetrics & Gynecology

## 2015-06-16 ENCOUNTER — Ambulatory Visit (INDEPENDENT_AMBULATORY_CARE_PROVIDER_SITE_OTHER): Payer: Medicaid Other | Admitting: Obstetrics and Gynecology

## 2015-06-16 VITALS — BP 118/76 | HR 83 | Wt 248.0 lb

## 2015-06-16 DIAGNOSIS — O1201 Gestational edema, first trimester: Secondary | ICD-10-CM

## 2015-06-16 DIAGNOSIS — Z1389 Encounter for screening for other disorder: Secondary | ICD-10-CM

## 2015-06-16 DIAGNOSIS — O09891 Supervision of other high risk pregnancies, first trimester: Secondary | ICD-10-CM

## 2015-06-16 DIAGNOSIS — O99211 Obesity complicating pregnancy, first trimester: Secondary | ICD-10-CM | POA: Diagnosis not present

## 2015-06-16 DIAGNOSIS — O99321 Drug use complicating pregnancy, first trimester: Secondary | ICD-10-CM | POA: Diagnosis not present

## 2015-06-16 DIAGNOSIS — F1110 Opioid abuse, uncomplicated: Secondary | ICD-10-CM

## 2015-06-16 DIAGNOSIS — Z331 Pregnant state, incidental: Secondary | ICD-10-CM

## 2015-06-16 DIAGNOSIS — Z3A11 11 weeks gestation of pregnancy: Secondary | ICD-10-CM | POA: Diagnosis not present

## 2015-06-16 LAB — POCT URINALYSIS DIPSTICK
Blood, UA: NEGATIVE
Glucose, UA: NEGATIVE
Ketones, UA: NEGATIVE
LEUKOCYTES UA: NEGATIVE
NITRITE UA: NEGATIVE
PROTEIN UA: NEGATIVE

## 2015-06-16 NOTE — Progress Notes (Signed)
WORK IN APPOINTMENT   [id number] Estimated Date of Delivery: 01/05/16  Blood pressure 118/76, pulse 83, weight 248 lb (112.492 kg), last menstrual period 03/17/2015.   refer to the ob flow sheet for FH and FHR, also BP, Wt, Urine results: negative  Patient , denies any bleeding and no rupture of membranes symptoms or regular contractions. Generalized edema particularly in BLE. Worse as she works 12 hr shifts.  18 lb weight gain this pregnancy.  Recent 18lb weight gain.  No enlargement of liver or spleen.  Physical Examination: General appearance - alert, well appearing, and in no distress, overweight, acyanotic, in no respiratory distress and ill-appearing Mental status - alert, oriented to person, place, and time, inappropriate safety awareness, fatigued.  Chest - clear to auscultation, no wheezes, rales or rhonchi, symmetric air entry Heart - normal rate, regular rhythm, normal S1, S2, no murmurs, rubs, clicks or gallops, S1 and S2 normal Abdomen - soft, nontender, nondistended, no masses or organomegaly Neurological - alert, oriented, normal speech, no focal findings or movement disorder noted Musculoskeletal - no joint tenderness, deformity or swelling hepatojugular reflux.   Patient complaints: Pt presents for f/u on ED visit. [facility] for swelling of BLE, bilateral feet, and bilateral hands with associated SOB onset yesterday while pt was at work. Pt notes she works at [employer] where she is standing for 12 hours/day (3 days on and 3 days off). Pt also complains of intermittent numbness to bilateral hands. Pt denies any groin pain or swelling in genital region. Pt further notes that she had n/v for approximately 2 weeks which resolved last week.   Questions were answered.no SOB DOE Assessment: 1. LROB [id number] @ 278w0d  2. 3+ pitting edema to knee 3 weight gain excessive  Plan:   1. Continued routine obstetrical care, has PNI visit this wk 2. F/u in 4  days for f/u.  3. Compression stockings use.  4. Labs, order CMET, TFT, cbc, as well as cardiac echo 5. Reduce hours to 8 hr shifts.   By signing my name below, I, Marica Otterusrat Rahman, attest that this documentation has been prepared under the direction and in the presence of Christin BachJohn Bosten Newstrom, MD. Electronically Signed: Marica OtterNusrat Rahman, ED Scribe. 06/16/2015. 2:34 PM.   I personally performed the services described in this documentation, which was SCRIBED in my presence. The recorded information has been reviewed and considered accurate. It has been edited as necessary during review. Tilda BurrowFERGUSON,Kiyaan Haq V, MD

## 2015-06-16 NOTE — Telephone Encounter (Signed)
Pt c/o swelling lower extremities "they are huge, Looks like I have 5 ankles." Pt states was sent home from work due to swelling in lower extremities, [facility]. Pt states has been pushing fluids and doesn't eat a lot of salt. Pt given an appt today for evaluation.

## 2015-06-16 NOTE — Progress Notes (Signed)
Pt worked in today for severe swelling in lower extremities and both hands have been going numb. Just started having edema in legs and feet.

## 2015-06-17 ENCOUNTER — Encounter: Payer: Self-pay | Admitting: Women's Health

## 2015-06-17 ENCOUNTER — Ambulatory Visit (INDEPENDENT_AMBULATORY_CARE_PROVIDER_SITE_OTHER): Payer: Medicaid Other | Admitting: Women's Health

## 2015-06-17 VITALS — BP 112/62 | HR 80 | Wt 247.0 lb

## 2015-06-17 DIAGNOSIS — O09891 Supervision of other high risk pregnancies, first trimester: Secondary | ICD-10-CM | POA: Diagnosis not present

## 2015-06-17 DIAGNOSIS — O3441 Maternal care for other abnormalities of cervix, first trimester: Secondary | ICD-10-CM | POA: Diagnosis not present

## 2015-06-17 DIAGNOSIS — O99321 Drug use complicating pregnancy, first trimester: Secondary | ICD-10-CM

## 2015-06-17 DIAGNOSIS — Z1389 Encounter for screening for other disorder: Secondary | ICD-10-CM | POA: Diagnosis not present

## 2015-06-17 DIAGNOSIS — Z9889 Other specified postprocedural states: Secondary | ICD-10-CM

## 2015-06-17 DIAGNOSIS — Z331 Pregnant state, incidental: Secondary | ICD-10-CM | POA: Diagnosis not present

## 2015-06-17 DIAGNOSIS — F418 Other specified anxiety disorders: Secondary | ICD-10-CM

## 2015-06-17 DIAGNOSIS — F1110 Opioid abuse, uncomplicated: Secondary | ICD-10-CM | POA: Diagnosis not present

## 2015-06-17 DIAGNOSIS — O99331 Smoking (tobacco) complicating pregnancy, first trimester: Secondary | ICD-10-CM

## 2015-06-17 DIAGNOSIS — Z3A11 11 weeks gestation of pregnancy: Secondary | ICD-10-CM

## 2015-06-17 DIAGNOSIS — F17200 Nicotine dependence, unspecified, uncomplicated: Secondary | ICD-10-CM

## 2015-06-17 DIAGNOSIS — Z3682 Encounter for antenatal screening for nuchal translucency: Secondary | ICD-10-CM

## 2015-06-17 DIAGNOSIS — O3440 Maternal care for other abnormalities of cervix, unspecified trimester: Secondary | ICD-10-CM | POA: Insufficient documentation

## 2015-06-17 DIAGNOSIS — F1111 Opioid abuse, in remission: Secondary | ICD-10-CM | POA: Insufficient documentation

## 2015-06-17 DIAGNOSIS — Z0283 Encounter for blood-alcohol and blood-drug test: Secondary | ICD-10-CM

## 2015-06-17 DIAGNOSIS — Z369 Encounter for antenatal screening, unspecified: Secondary | ICD-10-CM

## 2015-06-17 LAB — CBC
Hematocrit: 34.4 % (ref 34.0–46.6)
Hemoglobin: 11.1 g/dL (ref 11.1–15.9)
MCH: 29.3 pg (ref 26.6–33.0)
MCHC: 32.3 g/dL (ref 31.5–35.7)
MCV: 91 fL (ref 79–97)
PLATELETS: 281 10*3/uL (ref 150–379)
RBC: 3.79 x10E6/uL (ref 3.77–5.28)
RDW: 14.1 % (ref 12.3–15.4)
WBC: 7.3 10*3/uL (ref 3.4–10.8)

## 2015-06-17 LAB — COMPREHENSIVE METABOLIC PANEL
ALT: 15 IU/L (ref 0–32)
AST: 15 IU/L (ref 0–40)
Albumin/Globulin Ratio: 1.4 (ref 1.1–2.5)
Albumin: 3.3 g/dL — ABNORMAL LOW (ref 3.5–5.5)
Alkaline Phosphatase: 56 IU/L (ref 39–117)
BUN/Creatinine Ratio: 14 (ref 8–20)
BUN: 8 mg/dL (ref 6–20)
CALCIUM: 8 mg/dL — AB (ref 8.7–10.2)
CHLORIDE: 105 mmol/L (ref 96–106)
CO2: 22 mmol/L (ref 18–29)
Creatinine, Ser: 0.58 mg/dL (ref 0.57–1.00)
GFR calc non Af Amer: 118 mL/min/{1.73_m2} (ref >59)
GFR, EST AFRICAN AMERICAN: 136 mL/min/{1.73_m2} (ref >59)
GLUCOSE: 101 mg/dL — AB (ref 65–99)
Globulin, Total: 2.3 g/dL (ref 1.5–4.5)
Potassium: 4.3 mmol/L (ref 3.5–5.2)
Sodium: 140 mmol/L (ref 134–144)
TOTAL PROTEIN: 5.6 g/dL — AB (ref 6.0–8.5)

## 2015-06-17 LAB — POCT URINALYSIS DIPSTICK
GLUCOSE UA: NEGATIVE
KETONES UA: NEGATIVE
Leukocytes, UA: NEGATIVE
NITRITE UA: NEGATIVE
Protein, UA: NEGATIVE
RBC UA: NEGATIVE

## 2015-06-17 LAB — BRAIN NATRIURETIC PEPTIDE: BNP: 60.4 pg/mL (ref 0.0–100.0)

## 2015-06-17 NOTE — Patient Instructions (Addendum)
Stop Neurontin, Seroquel, and Trazadone  Wrist splints  Elevate legs as much as possible, wear compression hose   Nausea & Vomiting  Have saltine crackers or pretzels by your bed and eat a few bites before you raise your head out of bed in the morning  Eat small frequent meals throughout the day instead of large meals  Drink plenty of fluids throughout the day to stay hydrated, just don't drink a lot of fluids with your meals.  This can make your stomach fill up faster making you feel sick  Do not brush your teeth right after you eat  Products with real ginger are good for nausea, like ginger ale and ginger hard candy Make sure it says made with real ginger!  Sucking on sour candy like lemon heads is also good for nausea  If your prenatal vitamins make you nauseated, take them at night so you will sleep through the nausea  Sea Bands  If you feel like you need medicine for the nausea & vomiting please let us know  If you are unable to keep any fluids or food down please let us know   Constipation  Drink plenty of fluid, preferably water, throughout the day  Eat foods high in fiber such as fruits, vegetables, and grains  Exercise, such as walking, is a good way to keep your bowels regular  Drink warm fluids, especially warm prune juice, or decaf coffee  Eat a 1/2 cup of real oatmeal (not instant), 1/2 cup applesauce, and 1/2-1 cup warm prune juice every day  If needed, you may take Colace (docusate sodium) stool softener once or twice a day to help keep the stool soft. If you are pregnant, wait until you are out of your first trimester (12-14 weeks of pregnancy)  If you still are having problems with constipation, you may take Miralax once daily as needed to help keep your bowels regular.  If you are pregnant, wait until you are out of your first trimester (12-14 weeks of pregnancy)   First Trimester of Pregnancy The first trimester of pregnancy is from week 1 until the end  of week 12 (months 1 through 3). A week after a sperm fertilizes an egg, the egg will implant on the wall of the uterus. This embryo will begin to develop into a baby. Genes from you and your partner are forming the baby. The female genes determine whether the baby is a boy or a girl. At 6-8 weeks, the eyes and face are formed, and the heartbeat can be seen on ultrasound. At the end of 12 weeks, all the baby's organs are formed.  Now that you are pregnant, you will want to do everything you can to have a healthy baby. Two of the most important things are to get good prenatal care and to follow your health care provider's instructions. Prenatal care is all the medical care you receive before the baby's birth. This care will help prevent, find, and treat any problems during the pregnancy and childbirth. BODY CHANGES Your body goes through many changes during pregnancy. The changes vary from woman to woman.   You may gain or lose a couple of pounds at first.  You may feel sick to your stomach (nauseous) and throw up (vomit). If the vomiting is uncontrollable, call your health care provider.  You may tire easily.  You may develop headaches that can be relieved by medicines approved by your health care provider.  You may urinate more often.  Painful urination may mean you have a bladder infection.  You may develop heartburn as a result of your pregnancy.  You may develop constipation because certain hormones are causing the muscles that push waste through your intestines to slow down.  You may develop hemorrhoids or swollen, bulging veins (varicose veins).  Your breasts may begin to grow larger and become tender. Your nipples may stick out more, and the tissue that surrounds them (areola) may become darker.  Your gums may bleed and may be sensitive to brushing and flossing.  Dark spots or blotches (chloasma, mask of pregnancy) may develop on your face. This will likely fade after the baby is  born.  Your menstrual periods will stop.  You may have a loss of appetite.  You may develop cravings for certain kinds of food.  You may have changes in your emotions from day to day, such as being excited to be pregnant or being concerned that something may go wrong with the pregnancy and baby.  You may have more vivid and strange dreams.  You may have changes in your hair. These can include thickening of your hair, rapid growth, and changes in texture. Some women also have hair loss during or after pregnancy, or hair that feels dry or thin. Your hair will most likely return to normal after your baby is born. WHAT TO EXPECT AT YOUR PRENATAL VISITS During a routine prenatal visit:  You will be weighed to make sure you and the baby are growing normally.  Your blood pressure will be taken.  Your abdomen will be measured to track your baby's growth.  The fetal heartbeat will be listened to starting around week 10 or 12 of your pregnancy.  Test results from any previous visits will be discussed. Your health care provider may ask you:  How you are feeling.  If you are feeling the baby move.  If you have had any abnormal symptoms, such as leaking fluid, bleeding, severe headaches, or abdominal cramping.  If you have any questions. Other tests that may be performed during your first trimester include:  Blood tests to find your blood type and to check for the presence of any previous infections. They will also be used to check for low iron levels (anemia) and Rh antibodies. Later in the pregnancy, blood tests for diabetes will be done along with other tests if problems develop.  Urine tests to check for infections, diabetes, or protein in the urine.  An ultrasound to confirm the proper growth and development of the baby.  An amniocentesis to check for possible genetic problems.  Fetal screens for spina bifida and Down syndrome.  You may need other tests to make sure you and the  baby are doing well. HOME CARE INSTRUCTIONS  Medicines  Follow your health care provider's instructions regarding medicine use. Specific medicines may be either safe or unsafe to take during pregnancy.  Take your prenatal vitamins as directed.  If you develop constipation, try taking a stool softener if your health care provider approves. Diet  Eat regular, well-balanced meals. Choose a variety of foods, such as meat or vegetable-based protein, fish, milk and low-fat dairy products, vegetables, fruits, and whole grain breads and cereals. Your health care provider will help you determine the amount of weight gain that is right for you.  Avoid raw meat and uncooked cheese. These carry germs that can cause birth defects in the baby.  Eating four or five small meals rather than three large meals  a day may help relieve nausea and vomiting. If you start to feel nauseous, eating a few soda crackers can be helpful. Drinking liquids between meals instead of during meals also seems to help nausea and vomiting.  If you develop constipation, eat more high-fiber foods, such as fresh vegetables or fruit and whole grains. Drink enough fluids to keep your urine clear or pale yellow. Activity and Exercise  Exercise only as directed by your health care provider. Exercising will help you:  Control your weight.  Stay in shape.  Be prepared for labor and delivery.  Experiencing pain or cramping in the lower abdomen or low back is a good sign that you should stop exercising. Check with your health care provider before continuing normal exercises.  Try to avoid standing for long periods of time. Move your legs often if you must stand in one place for a long time.  Avoid heavy lifting.  Wear low-heeled shoes, and practice good posture.  You may continue to have sex unless your health care provider directs you otherwise. Relief of Pain or Discomfort  Wear a good support bra for breast tenderness.    Take warm sitz baths to soothe any pain or discomfort caused by hemorrhoids. Use hemorrhoid cream if your health care provider approves.   Rest with your legs elevated if you have leg cramps or low back pain.  If you develop varicose veins in your legs, wear support hose. Elevate your feet for 15 minutes, 3-4 times a day. Limit salt in your diet. Prenatal Care  Schedule your prenatal visits by the twelfth week of pregnancy. They are usually scheduled monthly at first, then more often in the last 2 months before delivery.  Write down your questions. Take them to your prenatal visits.  Keep all your prenatal visits as directed by your health care provider. Safety  Wear your seat belt at all times when driving.  Make a list of emergency phone numbers, including numbers for family, friends, the hospital, and police and fire departments. General Tips  Ask your health care provider for a referral to a local prenatal education class. Begin classes no later than at the beginning of month 6 of your pregnancy.  Ask for help if you have counseling or nutritional needs during pregnancy. Your health care provider can offer advice or refer you to specialists for help with various needs.  Do not use hot tubs, steam rooms, or saunas.  Do not douche or use tampons or scented sanitary pads.  Do not cross your legs for long periods of time.  Avoid cat litter boxes and soil used by cats. These carry germs that can cause birth defects in the baby and possibly loss of the fetus by miscarriage or stillbirth.  Avoid all smoking, herbs, alcohol, and medicines not prescribed by your health care provider. Chemicals in these affect the formation and growth of the baby.  Schedule a dentist appointment. At home, brush your teeth with a soft toothbrush and be gentle when you floss. SEEK MEDICAL CARE IF:   You have dizziness.  You have mild pelvic cramps, pelvic pressure, or nagging pain in the abdominal  area.  You have persistent nausea, vomiting, or diarrhea.  You have a bad smelling vaginal discharge.  You have pain with urination.  You notice increased swelling in your face, hands, legs, or ankles. SEEK IMMEDIATE MEDICAL CARE IF:   You have a fever.  You are leaking fluid from your vagina.  You have spotting  or bleeding from your vagina.  You have severe abdominal cramping or pain.  You have rapid weight gain or loss.  You vomit blood or material that looks like coffee grounds.  You are exposed to [place] measles and have never had them.  You are exposed to fifth disease or chickenpox.  You develop a severe headache.  You have shortness of breath.  You have any kind of trauma, such as from a fall or a car accident. Document Released: 06/15/2001 Document Revised: 11/05/2013 Document Reviewed: 05/01/2013 [facility] Patient Information 2015 [place], [place]. This information is not intended to replace advice given to you by your health care provider. Make sure you discuss any questions you have with your health care provider.

## 2015-06-17 NOTE — Progress Notes (Signed)
Subjective:  Alicia Barnett is a 39 y.o. [phone] Caucasian female at 5644w1d by 9wk u/s, being seen today for her first obstetrical visit.  Her obstetrical history is significant for TAB x 2, term ucnomplicated SVB x 3, h/o polysubstance abuse: xanax, hydrocodone, oxycodone- was clean x 9253yrs and relapsed about 8 months ago- is on suboxone 8mg  [facility] [place]- [facility], taking zoloft 150mg  daily, neurontin, seroquel, and trazadone for depression/anxiety, h/o LEEP 2008. Smoker: 1/2ppd, trying to cut back. Pregnancy not planned, had LEEP 2008 and has had unprotected sex since, just thought she couldn't get pregnant. Reports she has gained ~100lbs in last year. Reports swelling in legs and hands w/ tingling hands. Denies sob, cp. Saw [facility] yesterday for same- ordered cbc/cmp/bnp which were normal. Also ordered cardiac echo. Did get bilateral thigh high compression stockings as directed. Pregnancy history fully reviewed.  Patient reports some constipation w/ pnv, swelling legs/hands, tingling hands- works at [employer]. Denies vb, cramping, uti s/s, abnormal/malodorous vag d/c, or vulvovaginal itching/irritation.  BP 112/62 mmHg  Pulse 80  Wt 247 lb (112.038 kg)  LMP 03/17/2015 (Approximate)  HISTORY: OB History  Gravida Para Term Preterm AB SAB TAB Ectopic Multiple Living  6 3 3  2  2   3     # Outcome Date GA Lbr Len/2nd Weight Sex Delivery Anes PTL Lv  6 Current           5 Term 11/05/97 6394w0d  7 lb 7 oz (3.374 kg) F Toy CookeyVag-Spont  Y Y  4 Term 10/03/96 4246w0d  8 lb 2 oz (3.685 kg) F Vag-Spont EPI N Y  3 Term 02/02/94 5015w0d  7 lb 13 oz (3.544 kg) F Vag-Spont  N Y  2 TAB           1 TAB              Past Medical History  Diagnosis Date  . Polysubstance abuse   . Depression   . Anxiety    Past Surgical History  Procedure Laterality Date  . No past surgeries    . Leep     Family History  Problem Relation Age of Onset  . Heart failure  Mother   . Hypertension Mother   . Cancer Other   . Cancer Maternal Grandmother     lung  . Diabetes Maternal Grandfather   . Cancer Maternal Grandfather     liver  . Asthma Daughter     Exam   System:     General: Well developed & nourished, no acute distress   Skin: Warm & dry, normal coloration and turgor, no rashes   Neurologic: Alert & oriented, normal mood   Cardiovascular: Regular rate & rhythm   Respiratory: Effort & rate normal, LCTAB, acyanotic   Abdomen: Soft, non tender   Extremities: normal strength, tone, 2+ BLE edema  [facility] FHR: 165 via doppler   Assessment:   Pregnancy: [id number] Patient Active Problem List   Diagnosis Date Noted  . Supervision of other high-risk pregnancy 06/17/2015  . History of polysubstance abuse 06/17/2015  . Depression with anxiety 06/17/2015  . History of LEEP (loop electrosurgical excision procedure) of cervix complicating pregnancy 06/17/2015  . Smoker 06/17/2015    4644w1d [id number] New OB visit H/O polysubstance abuse, on suboxone now H/O LEEP Swelling lower extremities H/O recent 100lb weight gain Bilateral carpal tunnel Smoker  Depression/anxiety- on zoloft, seroquel, neurontin, trazadone  Plan:  Had some labs yesterday, wants to wait until next week to do PN1 Continue prenatal vitamins Gave printed info on constipation Problem list reviewed and updated Reviewed n/v relief measures and warning s/s to report Reviewed recommended weight gain based on pre-gravid BMI Encouraged well-balanced diet Genetic Screening discussed Integrated Screen: requested Cystic fibrosis screening discussed requested Ultrasound discussed; fetal survey: requested Follow up in 1 weeks for 1st it/nt and visit, then 4wks for 2nd IT and visit [facility] completed Per [facility] to stop seroquel, neurontin, trazadone. OK to continue zoloft Continue suboxone dosing via [facility] in  [place] Advised smoking cessation Wrist splints for bilateral wrists Wear compression stockings and elevate legs as much as possible Keep appt for cardiac echo  Marge Duncans CNM, [facility] 06/17/2015 2:56 PM

## 2015-06-19 LAB — GC/CHLAMYDIA PROBE AMP
CHLAMYDIA, DNA PROBE: NEGATIVE
Neisseria gonorrhoeae by PCR: NEGATIVE

## 2015-06-19 LAB — URINE CULTURE: ORGANISM ID, BACTERIA: NO GROWTH

## 2015-06-20 ENCOUNTER — Ambulatory Visit (HOSPITAL_COMMUNITY): Payer: Medicaid Other | Attending: Obstetrics and Gynecology

## 2015-06-25 ENCOUNTER — Other Ambulatory Visit: Payer: Medicaid Other

## 2015-07-01 ENCOUNTER — Ambulatory Visit (INDEPENDENT_AMBULATORY_CARE_PROVIDER_SITE_OTHER): Payer: Medicaid Other

## 2015-07-01 ENCOUNTER — Other Ambulatory Visit: Payer: Medicaid Other

## 2015-07-01 DIAGNOSIS — Z36 Encounter for antenatal screening of mother: Secondary | ICD-10-CM

## 2015-07-01 DIAGNOSIS — Z3A13 13 weeks gestation of pregnancy: Secondary | ICD-10-CM

## 2015-07-01 DIAGNOSIS — Z369 Encounter for antenatal screening, unspecified: Secondary | ICD-10-CM

## 2015-07-01 DIAGNOSIS — Z3682 Encounter for antenatal screening for nuchal translucency: Secondary | ICD-10-CM

## 2015-07-01 NOTE — Progress Notes (Signed)
US 13+1wks,measurements c/w dates,normal rt ov,unable to see lt ov,crl 72.739mm,NB present,NT 1.556mm,post pl gr 0

## 2015-07-03 LAB — MATERNAL SCREEN, INTEGRATED #1
Crown Rump Length: 72.9 mm
Gest. Age on Collection Date: 13.1 weeks
MATERNAL AGE AT EDD: 38.4 a
NUCHAL TRANSLUCENCY (NT): 1.6 mm
NUMBER OF FETUSES: 1
PAPP-A Value: 1025.4 ng/mL
Weight: 233 [lb_av]

## 2015-07-15 ENCOUNTER — Encounter: Payer: Medicaid Other | Admitting: Obstetrics & Gynecology

## 2015-07-15 ENCOUNTER — Encounter: Payer: Self-pay | Admitting: Obstetrics & Gynecology

## 2015-07-23 ENCOUNTER — Ambulatory Visit (INDEPENDENT_AMBULATORY_CARE_PROVIDER_SITE_OTHER): Payer: Medicaid Other | Admitting: Advanced Practice Midwife

## 2015-07-23 VITALS — BP 114/66 | HR 90

## 2015-07-23 DIAGNOSIS — Z331 Pregnant state, incidental: Secondary | ICD-10-CM | POA: Diagnosis not present

## 2015-07-23 DIAGNOSIS — Z1389 Encounter for screening for other disorder: Secondary | ICD-10-CM

## 2015-07-23 DIAGNOSIS — Z3A17 17 weeks gestation of pregnancy: Secondary | ICD-10-CM | POA: Diagnosis not present

## 2015-07-23 DIAGNOSIS — O99332 Smoking (tobacco) complicating pregnancy, second trimester: Secondary | ICD-10-CM | POA: Diagnosis not present

## 2015-07-23 DIAGNOSIS — O09892 Supervision of other high risk pregnancies, second trimester: Secondary | ICD-10-CM | POA: Diagnosis not present

## 2015-07-23 DIAGNOSIS — F1110 Opioid abuse, uncomplicated: Secondary | ICD-10-CM

## 2015-07-23 DIAGNOSIS — Z363 Encounter for antenatal screening for malformations: Secondary | ICD-10-CM

## 2015-07-23 DIAGNOSIS — F1910 Other psychoactive substance abuse, uncomplicated: Secondary | ICD-10-CM

## 2015-07-23 DIAGNOSIS — O99322 Drug use complicating pregnancy, second trimester: Secondary | ICD-10-CM | POA: Diagnosis not present

## 2015-07-23 NOTE — Progress Notes (Addendum)
Fetal Surveillance Testing today:  doppler   High Risk Pregnancy Diagnosis(es):   Polysubstance abuse  [id number] [redacted]w[redacted]d Estimated Date of Delivery: 01/05/16  Blood pressure 114/66, pulse 90, last menstrual period 03/17/2015.  Urinalysis: Negative   HPI: The patient is being seen today for ongoing management of pregnancy. Today she reports no complaints   BP weight and urine results all reviewed and noted. Patient reports good fetal movement, denies any bleeding and no rupture of membranes symptoms or regular contractions.  Fundal Height:  16 Fetal Heart rate:  145 Edema:  no  Patient is without complaints other than noted in her HPI. All questions were answered.  All lab and sonogram results have been reviewed. Comments:    Assessment:  1.  Pregnancy at [redacted]w[redacted]d,  Estimated Date of Delivery: 01/05/16 :                          2.  Polysubstance abuse                        3.    Medication(s) Plans:  Continue suboxone as prescribed by MD; pt would not discuss labs (+UDS) she was late picking up her kid from school and left abruptly  Treatment Plan:  2nd IT today  Return in about 3 weeks (around 08/13/2015) for XB:JYNWGNF, HROB. for appointment for high risk OB care  Meds ordered this encounter  Medications  . buprenorphine (SUBUTEX) 8 MG SUBL SL tablet    Sig: Place 8 mg under the tongue 3 (three) times daily.   Orders Placed This Encounter  Procedures  . US OB Comp + 14 Wk  . Pain Management Screening Profile (10S)  . Maternal Screen, Integrated #2  . POCT urinalysis dipstick

## 2015-07-24 LAB — PMP SCREEN PROFILE (10S), URINE
AMPHETAMINE SCRN UR: NEGATIVE ng/mL
Barbiturate Screen, Ur: NEGATIVE ng/mL
Benzodiazepine Screen, Urine: POSITIVE ng/mL
CREATININE(CRT), U: 202.5 mg/dL (ref 20.0–300.0)
Cannabinoids Ur Ql Scn: POSITIVE ng/mL
Cocaine(Metab.)Screen, Urine: POSITIVE ng/mL
METHADONE SCREEN, URINE: NEGATIVE ng/mL
OPIATE SCRN UR: NEGATIVE ng/mL
OXYCODONE+OXYMORPHONE UR QL SCN: NEGATIVE ng/mL
PCP SCRN UR: NEGATIVE ng/mL
PH UR, DRUG SCRN: 6.2 (ref 4.5–8.9)
PROPOXYPHENE SCREEN: NEGATIVE ng/mL

## 2015-08-05 ENCOUNTER — Other Ambulatory Visit: Payer: Medicaid Other

## 2015-08-07 ENCOUNTER — Telehealth: Payer: Self-pay | Admitting: Advanced Practice Midwife

## 2015-08-08 NOTE — Telephone Encounter (Signed)
Left message x 3 to return call with no response.

## 2015-08-09 LAB — MATERNAL SCREEN, INTEGRATED #2
AFP MARKER: 44.4 ng/mL
AFP MoM: 1.5
CROWN RUMP LENGTH: 72.9 mm
DIA MOM: 1.77
DIA Value: 245.3 pg/mL
Estriol, Unconjugated: 1.38 ng/mL
GESTATIONAL AGE: 18.4 wk
Gest. Age on Collection Date: 13.1 weeks
Maternal Age at EDD: 38.4 years
NUCHAL TRANSLUCENCY MOM: 0.89
Nuchal Translucency (NT): 1.6 mm
Number of Fetuses: 1
PAPP-A MOM: 1.4
PAPP-A Value: 1025.4 ng/mL
TEST RESULTS: NEGATIVE
WEIGHT: 231 [lb_av]
Weight: 233 [lb_av]
hCG MoM: 1.55
hCG Value: 26.6 IU/mL
uE3 MoM: 1.1

## 2015-08-12 ENCOUNTER — Encounter: Payer: Self-pay | Admitting: Obstetrics & Gynecology

## 2015-08-12 ENCOUNTER — Ambulatory Visit (INDEPENDENT_AMBULATORY_CARE_PROVIDER_SITE_OTHER): Payer: Medicaid Other

## 2015-08-12 ENCOUNTER — Ambulatory Visit (INDEPENDENT_AMBULATORY_CARE_PROVIDER_SITE_OTHER): Payer: Medicaid Other | Admitting: Obstetrics & Gynecology

## 2015-08-12 VITALS — BP 110/60 | HR 74 | Wt 224.0 lb

## 2015-08-12 DIAGNOSIS — O99321 Drug use complicating pregnancy, first trimester: Secondary | ICD-10-CM

## 2015-08-12 DIAGNOSIS — F1110 Opioid abuse, uncomplicated: Secondary | ICD-10-CM

## 2015-08-12 DIAGNOSIS — Z36 Encounter for antenatal screening of mother: Secondary | ICD-10-CM

## 2015-08-12 DIAGNOSIS — Z331 Pregnant state, incidental: Secondary | ICD-10-CM | POA: Diagnosis not present

## 2015-08-12 DIAGNOSIS — Z1389 Encounter for screening for other disorder: Secondary | ICD-10-CM

## 2015-08-12 DIAGNOSIS — O99331 Smoking (tobacco) complicating pregnancy, first trimester: Secondary | ICD-10-CM

## 2015-08-12 DIAGNOSIS — Z363 Encounter for antenatal screening for malformations: Secondary | ICD-10-CM

## 2015-08-12 DIAGNOSIS — O09893 Supervision of other high risk pregnancies, third trimester: Secondary | ICD-10-CM

## 2015-08-12 DIAGNOSIS — O09892 Supervision of other high risk pregnancies, second trimester: Secondary | ICD-10-CM

## 2015-08-12 DIAGNOSIS — Z3A19 19 weeks gestation of pregnancy: Secondary | ICD-10-CM | POA: Diagnosis not present

## 2015-08-12 LAB — POCT URINALYSIS DIPSTICK
Blood, UA: NEGATIVE
Glucose, UA: NEGATIVE
Leukocytes, UA: NEGATIVE
Nitrite, UA: NEGATIVE

## 2015-08-12 MED ORDER — OMEPRAZOLE 20 MG PO CPDR: 20.0000 mg | DELAYED_RELEASE_CAPSULE | Freq: Every day | ORAL | Status: DC

## 2015-08-12 NOTE — Progress Notes (Signed)
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

## 2015-08-12 NOTE — Progress Notes (Signed)
[id number] [redacted]w[redacted]d Estimated Date of Delivery: 01/05/16  Blood pressure 110/60, pulse 74, weight 224 lb (101.606 kg), last menstrual period 03/17/2015.   BP weight and urine results all reviewed and noted.  Please refer to the obstetrical flow sheet for the fundal height and fetal heart rate documentation:  Patient reports good fetal movement, denies any bleeding and no rupture of membranes symptoms or regular contractions. Patient is without complaints. All questions were answered.  Orders Placed This Encounter  Procedures  . POCT urinalysis dipstick    Plan:  Continued routine obstetrical care, sonogram is read and report done  Return in about 4 weeks (around 09/09/2015) for LROB.   Meds ordered this encounter  Medications  . omeprazole (PRILOSEC) 20 MG capsule    Sig: Take 1 capsule (20 mg total) by mouth daily.    Dispense:  30 capsule    Refill:  11   For GERD and coughing when laying down

## 2015-09-09 ENCOUNTER — Encounter: Payer: Self-pay | Admitting: Women's Health

## 2015-09-09 ENCOUNTER — Telehealth: Payer: Self-pay | Admitting: *Deleted

## 2015-09-09 ENCOUNTER — Ambulatory Visit (INDEPENDENT_AMBULATORY_CARE_PROVIDER_SITE_OTHER): Payer: Medicaid Other | Admitting: Women's Health

## 2015-09-09 VITALS — BP 100/70 | HR 76 | Wt 227.0 lb

## 2015-09-09 DIAGNOSIS — Z331 Pregnant state, incidental: Secondary | ICD-10-CM

## 2015-09-09 DIAGNOSIS — Z1389 Encounter for screening for other disorder: Secondary | ICD-10-CM | POA: Diagnosis not present

## 2015-09-09 DIAGNOSIS — O09892 Supervision of other high risk pregnancies, second trimester: Secondary | ICD-10-CM

## 2015-09-09 DIAGNOSIS — F1910 Other psychoactive substance abuse, uncomplicated: Secondary | ICD-10-CM

## 2015-09-09 DIAGNOSIS — O358XX0 Maternal care for other (suspected) fetal abnormality and damage, not applicable or unspecified: Secondary | ICD-10-CM | POA: Diagnosis not present

## 2015-09-09 DIAGNOSIS — O350XX0 Maternal care for (suspected) central nervous system malformation in fetus, not applicable or unspecified: Secondary | ICD-10-CM

## 2015-09-09 DIAGNOSIS — O3503X0 Maternal care for (suspected) central nervous system malformation or damage in fetus, choroid plexus cysts, not applicable or unspecified: Secondary | ICD-10-CM

## 2015-09-09 LAB — POCT URINALYSIS DIPSTICK
Blood, UA: NEGATIVE
GLUCOSE UA: NEGATIVE
KETONES UA: NEGATIVE
LEUKOCYTES UA: NEGATIVE
Nitrite, UA: NEGATIVE
PROTEIN UA: NEGATIVE

## 2015-09-09 NOTE — Patient Instructions (Signed)
You will have your sugar test next visit.  Please do not eat or drink anything after midnight the night before you come, not even water.  You will be here for at least two hours.     Call the office ([phone]) or go to [facility] if:  You begin to have strong, frequent contractions  Your water breaks.  Sometimes it is a big gush of fluid, sometimes it is just a trickle that keeps getting your panties wet or running down your legs  You have vaginal bleeding.  It is normal to have a small amount of spotting if your cervix was checked.   You don't feel your baby moving like normal.  If you don't, get you something to eat and drink and lay down and focus on feeling your baby move.   If your baby is still not moving like normal, you should call the office or go to [facility].  Second Trimester of Pregnancy The second trimester is from week 13 through week 28, months 4 through 6. The second trimester is often a time when you feel your best. Your body has also adjusted to being pregnant, and you begin to feel better physically. Usually, morning sickness has lessened or quit completely, you may have more energy, and you may have an increase in appetite. The second trimester is also a time when the fetus is growing rapidly. At the end of the sixth month, the fetus is about 9 inches long and weighs about 1 pounds. You will likely begin to feel the baby move (quickening) between 18 and 20 weeks of the pregnancy. BODY CHANGES Your body goes through many changes during pregnancy. The changes vary from woman to woman.   Your weight will continue to increase. You will notice your lower abdomen bulging out.  You may begin to get stretch marks on your hips, abdomen, and breasts.  You may develop headaches that can be relieved by medicines approved by your health care provider.  You may urinate more often because the fetus is pressing on your bladder.  You may develop or continue to have  heartburn as a result of your pregnancy.  You may develop constipation because certain hormones are causing the muscles that push waste through your intestines to slow down.  You may develop hemorrhoids or swollen, bulging veins (varicose veins).  You may have back pain because of the weight gain and pregnancy hormones relaxing your joints between the bones in your pelvis and as a result of a shift in weight and the muscles that support your balance.  Your breasts will continue to grow and be tender.  Your gums may bleed and may be sensitive to brushing and flossing.  Dark spots or blotches (chloasma, mask of pregnancy) may develop on your face. This will likely fade after the baby is born.  A dark line from your belly button to the pubic area (linea nigra) may appear. This will likely fade after the baby is born.  You may have changes in your hair. These can include thickening of your hair, rapid growth, and changes in texture. Some women also have hair loss during or after pregnancy, or hair that feels dry or thin. Your hair will most likely return to normal after your baby is born. WHAT TO EXPECT AT YOUR PRENATAL VISITS During a routine prenatal visit:  You will be weighed to make sure you and the fetus are growing normally.  Your blood pressure will be taken.    Your abdomen will be measured to track your baby's growth.  The fetal heartbeat will be listened to.  Any test results from the previous visit will be discussed. Your health care provider may ask you:  How you are feeling.  If you are feeling the baby move.  If you have had any abnormal symptoms, such as leaking fluid, bleeding, severe headaches, or abdominal cramping.  If you have any questions. Other tests that may be performed during your second trimester include:  Blood tests that check for:  Low iron levels (anemia).  Gestational diabetes (between 24 and 28 weeks).  Rh antibodies.  Urine tests to check  for infections, diabetes, or protein in the urine.  An ultrasound to confirm the proper growth and development of the baby.  An amniocentesis to check for possible genetic problems.  Fetal screens for spina bifida and Down syndrome. HOME CARE INSTRUCTIONS   Avoid all smoking, herbs, alcohol, and unprescribed drugs. These chemicals affect the formation and growth of the baby.  Follow your health care provider's instructions regarding medicine use. There are medicines that are either safe or unsafe to take during pregnancy.  Exercise only as directed by your health care provider. Experiencing uterine cramps is a good sign to stop exercising.  Continue to eat regular, healthy meals.  Wear a good support bra for breast tenderness.  Do not use hot tubs, steam rooms, or saunas.  Wear your seat belt at all times when driving.  Avoid raw meat, uncooked cheese, cat litter boxes, and soil used by cats. These carry germs that can cause birth defects in the baby.  Take your prenatal vitamins.  Try taking a stool softener (if your health care provider approves) if you develop constipation. Eat more high-fiber foods, such as fresh vegetables or fruit and whole grains. Drink plenty of fluids to keep your urine clear or pale yellow.  Take warm sitz baths to soothe any pain or discomfort caused by hemorrhoids. Use hemorrhoid cream if your health care provider approves.  If you develop varicose veins, wear support hose. Elevate your feet for 15 minutes, 3-4 times a day. Limit salt in your diet.  Avoid heavy lifting, wear low heel shoes, and practice good posture.  Rest with your legs elevated if you have leg cramps or low back pain.  Visit your dentist if you have not gone yet during your pregnancy. Use a soft toothbrush to brush your teeth and be gentle when you floss.  A sexual relationship may be continued unless your health care provider directs you otherwise.  Continue to go to all your  prenatal visits as directed by your health care provider. SEEK MEDICAL CARE IF:   You have dizziness.  You have mild pelvic cramps, pelvic pressure, or nagging pain in the abdominal area.  You have persistent nausea, vomiting, or diarrhea.  You have a bad smelling vaginal discharge.  You have pain with urination. SEEK IMMEDIATE MEDICAL CARE IF:   You have a fever.  You are leaking fluid from your vagina.  You have spotting or bleeding from your vagina.  You have severe abdominal cramping or pain.  You have rapid weight gain or loss.  You have shortness of breath with chest pain.  You notice sudden or extreme swelling of your face, hands, ankles, feet, or legs.  You have not felt your baby move in over an hour.  You have severe headaches that do not go away with medicine.  You have vision changes.   Document Released: 06/15/2001 Document Revised: 06/26/2013 Document Reviewed: 08/22/2012 Exit[employer] Patient Information 2015 [employer]. This information is not intended to replace advice given to you by your health care provider. Make sure you discuss any questions you have with your health care provider.     

## 2015-09-09 NOTE — Telephone Encounter (Signed)
Pt informed to go to [facility] for Urine sample. Pt states she will go to [facility] tomorrow 09/10/2015 at 0830 for UDS.

## 2015-09-09 NOTE — Progress Notes (Signed)
High Risk Pregnancy Diagnosis(es): Methadone, polysubstance abuse (cocaine, benzo, THC) [id number] 3148w1d Estimated Date of Delivery: 01/05/16 BP 100/70 mmHg  Pulse 76  Wt 227 lb (102.967 kg)  LMP 03/17/2015 (Approximate)  Urinalysis: Negative HPI:  Discussed UDS+ for cocaine, benzo, THC. States last used cocaine middle of Jan, was taking Xanax from friend- last used 3wks ago. Advised not to resume using any illicit drugs. [facility]- [facility] [place] to Methadone 45mg  daily. She sees counselor weekly and MD 3x/mth. Has not stopped Neurontin as directed, is taking for anxiety, was taking 7 300mg  tabs daily (2100mg ), has decreased to 4 300mg  tabs daily (1200mg ), states she will wean herself off of it. Never got PN1 drawn.  BP, weight, and urine reviewed.  Reports good fm. Denies regular uc's, lof, vb, uti s/s. No complaints.  Fundal Height:  24 Fetal Heart rate:  148 Edema:  trace  Reviewed ptl s/s, fm.  All questions were answered Assessment: 6548w1d polysubstance abuse, methadone Medication(s) Plans:  Continue methadone per [facility], do not resume any illicit drugs, wean off Neurontin Treatment Plan:  U/s in 4wks for growth and f/u CP cyst Follow up in 4wks for high-risk OB appt, u/s f/u CP cyst/growth for polysubstance use PN1 and UDS today

## 2015-09-10 ENCOUNTER — Encounter: Payer: Self-pay | Admitting: Women's Health

## 2015-09-10 DIAGNOSIS — Z283 Underimmunization status: Secondary | ICD-10-CM | POA: Insufficient documentation

## 2015-09-10 DIAGNOSIS — Z2839 Other underimmunization status: Secondary | ICD-10-CM | POA: Insufficient documentation

## 2015-09-10 DIAGNOSIS — O9989 Other specified diseases and conditions complicating pregnancy, childbirth and the puerperium: Secondary | ICD-10-CM

## 2015-09-16 LAB — CYSTIC FIBROSIS MUTATION 97: Interpretation: NOT DETECTED

## 2015-09-16 LAB — HIV ANTIBODY (ROUTINE TESTING W REFLEX): HIV SCREEN 4TH GENERATION: NONREACTIVE

## 2015-09-16 LAB — ANTIBODY SCREEN: ANTIBODY SCREEN: NEGATIVE

## 2015-09-16 LAB — HEPATITIS B SURFACE ANTIGEN: HEP B S AG: NEGATIVE

## 2015-09-16 LAB — ABO/RH: RH TYPE: POSITIVE

## 2015-09-16 LAB — RUBELLA SCREEN: Rubella Antibodies, IGG: <0.9 index — ABNORMAL LOW (ref >0.99)

## 2015-09-16 LAB — VARICELLA ZOSTER ANTIBODY, IGG: VARICELLA: 451 {index} (ref >165)

## 2015-09-16 LAB — RPR: RPR Ser Ql: NONREACTIVE

## 2015-10-07 ENCOUNTER — Ambulatory Visit (INDEPENDENT_AMBULATORY_CARE_PROVIDER_SITE_OTHER): Payer: Medicaid Other

## 2015-10-07 ENCOUNTER — Other Ambulatory Visit: Payer: Medicaid Other

## 2015-10-07 ENCOUNTER — Encounter: Payer: Self-pay | Admitting: Advanced Practice Midwife

## 2015-10-07 ENCOUNTER — Ambulatory Visit (INDEPENDENT_AMBULATORY_CARE_PROVIDER_SITE_OTHER): Payer: Medicaid Other | Admitting: Advanced Practice Midwife

## 2015-10-07 VITALS — BP 110/72 | HR 86 | Wt 223.0 lb

## 2015-10-07 DIAGNOSIS — Z331 Pregnant state, incidental: Secondary | ICD-10-CM

## 2015-10-07 DIAGNOSIS — Z369 Encounter for antenatal screening, unspecified: Secondary | ICD-10-CM

## 2015-10-07 DIAGNOSIS — F1910 Other psychoactive substance abuse, uncomplicated: Secondary | ICD-10-CM

## 2015-10-07 DIAGNOSIS — Z131 Encounter for screening for diabetes mellitus: Secondary | ICD-10-CM

## 2015-10-07 DIAGNOSIS — O09893 Supervision of other high risk pregnancies, third trimester: Secondary | ICD-10-CM | POA: Diagnosis not present

## 2015-10-07 DIAGNOSIS — F1111 Opioid abuse, in remission: Secondary | ICD-10-CM

## 2015-10-07 DIAGNOSIS — O321XX1 Maternal care for breech presentation, fetus 1: Secondary | ICD-10-CM | POA: Diagnosis not present

## 2015-10-07 DIAGNOSIS — O350XX0 Maternal care for (suspected) central nervous system malformation in fetus, not applicable or unspecified: Secondary | ICD-10-CM

## 2015-10-07 DIAGNOSIS — O358XX0 Maternal care for other (suspected) fetal abnormality and damage, not applicable or unspecified: Secondary | ICD-10-CM | POA: Diagnosis not present

## 2015-10-07 DIAGNOSIS — F1110 Opioid abuse, uncomplicated: Secondary | ICD-10-CM

## 2015-10-07 DIAGNOSIS — Z1389 Encounter for screening for other disorder: Secondary | ICD-10-CM

## 2015-10-07 DIAGNOSIS — Z3A28 28 weeks gestation of pregnancy: Secondary | ICD-10-CM | POA: Diagnosis not present

## 2015-10-07 DIAGNOSIS — O3503X0 Maternal care for (suspected) central nervous system malformation or damage in fetus, choroid plexus cysts, not applicable or unspecified: Secondary | ICD-10-CM

## 2015-10-07 LAB — POCT URINALYSIS DIPSTICK
GLUCOSE UA: NEGATIVE
Ketones, UA: NEGATIVE
Leukocytes, UA: NEGATIVE
NITRITE UA: NEGATIVE
Protein, UA: NEGATIVE
RBC UA: NEGATIVE

## 2015-10-07 MED ORDER — GABAPENTIN 300 MG PO CAPS: 300.0000 mg | ORAL_CAPSULE | Freq: Three times a day (TID) | ORAL | Status: DC

## 2015-10-07 NOTE — Patient Instructions (Signed)
[facility] [address] [place]

## 2015-10-07 NOTE — Progress Notes (Signed)
Pt denies any problems or concerns at this time.  

## 2015-10-07 NOTE — Progress Notes (Signed)
US 27+1 wks,breech,post pl gr 1,bilat adnexa's wnl,afi 13cm,CPC not seen on today's ultrasound,fhr 148 bpm,efw 1070 g,55%,fetal heart anatomy seen,no obvious abnormalities seen

## 2015-10-07 NOTE — Progress Notes (Signed)
Fetal Surveillance Testing today:  US   High Risk Pregnancy Diagnosis(es):   Polysubstance abuse  [id number] 4455w1d Estimated Date of Delivery: 01/05/16  Blood pressure 110/72, pulse 86, weight 223 lb (101.152 kg), last menstrual period 03/17/2015.  Urinalysis: Negative   HPI: The patient is being seen today for ongoing management of pregnancy, polysubstance abuse. Today she reports she is still going to [facility], plans to stay on 65mg /day.  States can not stop Neurontin (for anxiety), terrified of relapse.  Still smoking cigarettes, stopped MJ. Considering e cigs. Wants to BF to ease w/d in baby. Wants in house BTL, papers signed today.    BP weight and urine results all reviewed and noted. Patient reports good fetal movement, denies any bleeding and no rupture of membranes symptoms or regular contractions.  Fetal Heart rate:  148 Edema:  no  Patient is without complaints other than noted in her HPI. All questions were answered.  All lab and sonogram results have been reviewed.   US 27+1 wks,breech,post pl gr 1,bilat adnexa's wnl,afi 13cm,CPC not seen on today's ultrasound,fhr 148 bpm,efw 1070 g,55%,fetal heart anatomy seen,no obvious abnormalities seen        Comments: normal   Assessment:  1.  Pregnancy at 5455w1d,  Estimated Date of Delivery: 01/05/16 :                          2.  Polysubstance abuse, denies current elicit use                        3.    Medication(s) Plans:  Continue Methadone 65 mg daily  Treatment Plan:  Continue to monitor UDS.  BTL papers signed today.  Plan NICU tour. PN2 today  Return in about 3 weeks (around 10/31/2015) for HROB. for appointment for high risk OB care  Meds ordered this encounter  Medications  . methadone (DOLOPHINE) 1 MG/1ML solution    Sig: Take 65 mg by mouth daily.  Marland Kitchen. gabapentin (NEURONTIN) 300 MG capsule    Sig: Take 1 capsule (300 mg total) by mouth 3 (three) times daily. Take one tablet at lunch, two tablets at dinner, and  four at bedtime.    Dispense:  210 capsule    Refill:  3    Order Specific Question:  Supervising Provider    Answer:  Duane LopeEURE, LUTHER H [[id number]]   Orders Placed This Encounter  Procedures  . POCT urinalysis dipstick

## 2015-10-08 LAB — CBC
HEMATOCRIT: 32.3 % — AB (ref 34.0–46.6)
HEMOGLOBIN: 10.8 g/dL — AB (ref 11.1–15.9)
MCH: 29.1 pg (ref 26.6–33.0)
MCHC: 33.4 g/dL (ref 31.5–35.7)
MCV: 87 fL (ref 79–97)
PLATELETS: 281 10*3/uL (ref 150–379)
RBC: 3.71 x10E6/uL — AB (ref 3.77–5.28)
RDW: 15.2 % (ref 12.3–15.4)
WBC: 10.5 10*3/uL (ref 3.4–10.8)

## 2015-10-08 LAB — GLUCOSE TOLERANCE, 2 HOURS W/ 1HR
GLUCOSE, 2 HOUR: 104 mg/dL (ref 65–152)
Glucose, 1 hour: 98 mg/dL (ref 65–179)
Glucose, Fasting: 73 mg/dL (ref 65–91)

## 2015-10-08 LAB — RPR: RPR Ser Ql: NONREACTIVE

## 2015-10-08 LAB — HIV ANTIBODY (ROUTINE TESTING W REFLEX): HIV Screen 4th Generation wRfx: NONREACTIVE

## 2015-10-08 LAB — ANTIBODY SCREEN: ANTIBODY SCREEN: NEGATIVE

## 2015-10-09 ENCOUNTER — Telehealth: Payer: Self-pay | Admitting: Obstetrics & Gynecology

## 2015-10-09 LAB — PMP SCREEN PROFILE (10S), URINE
AMPHETAMINE SCRN UR: NEGATIVE ng/mL
BARBITURATE SCRN UR: NEGATIVE ng/mL
Benzodiazepine Screen, Urine: NEGATIVE ng/mL
Cannabinoids Ur Ql Scn: NEGATIVE ng/mL
Cocaine(Metab.)Screen, Urine: NEGATIVE ng/mL
Creatinine(Crt), U: 112.6 mg/dL (ref 20.0–300.0)
METHADONE SCREEN, URINE: POSITIVE ng/mL
OPIATE SCRN UR: NEGATIVE ng/mL
Oxycodone+Oxymorphone Ur Ql Scn: NEGATIVE ng/mL
PCP SCRN UR: NEGATIVE ng/mL
PROPOXYPHENE SCREEN: NEGATIVE ng/mL
Ph of Urine: 6 (ref 4.5–8.9)

## 2015-10-09 NOTE — Telephone Encounter (Signed)
Pt informed Glucose tolerance test results from 10/07/2015 WNL.

## 2015-10-14 ENCOUNTER — Telehealth: Payer: Self-pay | Admitting: *Deleted

## 2015-10-14 NOTE — Telephone Encounter (Signed)
Spoke with pt letting her know Gabapentin was [facility] to [facility]. Pt voiced understanding. JSY

## 2015-10-31 ENCOUNTER — Encounter: Payer: Self-pay | Admitting: Obstetrics and Gynecology

## 2015-10-31 ENCOUNTER — Ambulatory Visit (INDEPENDENT_AMBULATORY_CARE_PROVIDER_SITE_OTHER): Payer: Medicaid Other | Admitting: Obstetrics and Gynecology

## 2015-10-31 VITALS — BP 120/80 | Wt 223.0 lb

## 2015-10-31 DIAGNOSIS — Z3A31 31 weeks gestation of pregnancy: Secondary | ICD-10-CM

## 2015-10-31 DIAGNOSIS — Z1389 Encounter for screening for other disorder: Secondary | ICD-10-CM

## 2015-10-31 DIAGNOSIS — O358XX0 Maternal care for other (suspected) fetal abnormality and damage, not applicable or unspecified: Secondary | ICD-10-CM

## 2015-10-31 DIAGNOSIS — O321XX1 Maternal care for breech presentation, fetus 1: Secondary | ICD-10-CM | POA: Diagnosis not present

## 2015-10-31 DIAGNOSIS — F1110 Opioid abuse, uncomplicated: Secondary | ICD-10-CM | POA: Diagnosis not present

## 2015-10-31 DIAGNOSIS — Z331 Pregnant state, incidental: Secondary | ICD-10-CM

## 2015-10-31 DIAGNOSIS — O99323 Drug use complicating pregnancy, third trimester: Secondary | ICD-10-CM

## 2015-10-31 DIAGNOSIS — O09893 Supervision of other high risk pregnancies, third trimester: Secondary | ICD-10-CM

## 2015-10-31 LAB — POCT URINALYSIS DIPSTICK
GLUCOSE UA: NEGATIVE
Ketones, UA: NEGATIVE
Leukocytes, UA: NEGATIVE
NITRITE UA: NEGATIVE
Protein, UA: NEGATIVE
RBC UA: NEGATIVE

## 2015-10-31 NOTE — Progress Notes (Signed)
Pt states that her hands go numb when she is sleeping.

## 2015-10-31 NOTE — Progress Notes (Signed)
Patient ID: Alicia Barnett, female   DOB: 03/03/1976, 40 y.o.   MRN: [id number]  High Risk Pregnancy Diagnosis(es): Polysubstance abuse  [id number] 5293w4d Estimated Date of Delivery: 01/05/16    HPI: The patient is being seen today for ongoing management of high risk pregnancy, polysubstance abuse. Today she reports intermittently waking up with numbness to her hands after waking up.  Patient reports good fetal movement, denies any bleeding and no rupture of membranes symptoms or regular contractions. BP weight and urine results reviewed and noted. Blood pressure 120/80, weight 223 lb (101.152 kg), last menstrual period 03/17/2015.  Fetal Surveillance Testing today:  Not done Fundal Height:  32.5cm Fetal Heart rate:  147 Edema:  None Urinalysis: Negative  Questions were answered.  Lab and sonogram results have been reviewed. Comments: normal   Assessment:  1.  Pregnancy at 4993w4d,  Estimated Date of Delivery: 01/05/16                          2.  Polysubstance abuse, on methadone 45mg  daily                         3.  Persistent breech  Medication(s) Plans:  No changes   Treatment Plan:  No changes  Follow up in 2 weeks for appointment for high risk OB care consider external version if still breech at 36 weeks  By signing my name below, I, Marica OtterNusrat Rahman, attest that this documentation has been prepared under the direction and in the presence of Christin BachJohn Tristyn Demarest, MD. Electronically Signed: Marica OtterNusrat Rahman, ED Scribe. 10/31/2015. 1:16 PM.   I personally performed the services described in this documentation, which was SCRIBED in my presence. The recorded information has been reviewed and considered accurate. It has been edited as necessary during review. Tilda BurrowFERGUSON,Brennyn Haisley V, MD   I personally performed the services described in this documentation, which was SCRIBED in my presence. The recorded information has been reviewed and considered accurate. It has been edited as necessary during  review. Tilda BurrowFERGUSON,Dellar Traber V, MD

## 2015-11-12 ENCOUNTER — Ambulatory Visit (INDEPENDENT_AMBULATORY_CARE_PROVIDER_SITE_OTHER): Payer: Medicaid Other | Admitting: Advanced Practice Midwife

## 2015-11-12 VITALS — BP 110/60 | HR 88 | Wt 221.0 lb

## 2015-11-12 DIAGNOSIS — O26893 Other specified pregnancy related conditions, third trimester: Secondary | ICD-10-CM

## 2015-11-12 DIAGNOSIS — N949 Unspecified condition associated with female genital organs and menstrual cycle: Secondary | ICD-10-CM | POA: Diagnosis not present

## 2015-11-12 DIAGNOSIS — Z331 Pregnant state, incidental: Secondary | ICD-10-CM

## 2015-11-12 DIAGNOSIS — Z1389 Encounter for screening for other disorder: Secondary | ICD-10-CM | POA: Diagnosis not present

## 2015-11-12 DIAGNOSIS — O358XX0 Maternal care for other (suspected) fetal abnormality and damage, not applicable or unspecified: Secondary | ICD-10-CM

## 2015-11-12 DIAGNOSIS — F1110 Opioid abuse, uncomplicated: Secondary | ICD-10-CM | POA: Diagnosis not present

## 2015-11-12 DIAGNOSIS — O99323 Drug use complicating pregnancy, third trimester: Secondary | ICD-10-CM

## 2015-11-12 DIAGNOSIS — O09893 Supervision of other high risk pregnancies, third trimester: Secondary | ICD-10-CM

## 2015-11-12 DIAGNOSIS — Z3A33 33 weeks gestation of pregnancy: Secondary | ICD-10-CM | POA: Diagnosis not present

## 2015-11-12 LAB — POCT URINALYSIS DIPSTICK
GLUCOSE UA: NEGATIVE
LEUKOCYTES UA: NEGATIVE
NITRITE UA: NEGATIVE
Protein, UA: NEGATIVE
RBC UA: NEGATIVE

## 2015-11-12 NOTE — Patient Instructions (Signed)

## 2015-11-12 NOTE — Progress Notes (Signed)
   Fetal Surveillance Testing today:  doppler   High Risk Pregnancy Diagnosis(es):   Polysubstance abuse, on methadone  [id number] 6149w2d Estimated Date of Delivery: 01/05/16  Blood pressure 110/60, pulse 88, weight 221 lb (100.245 kg), last menstrual period 03/17/2015.  Urinalysis: Positive for ketones   HPI: WORK IN FOR PELVIC PRESSURE/CRAMPS SINCE LAST NIGHT. Step daughter broke her femur playing softball, had surgery last night, stressed. Baby had been breech Cx LTC, firm. Feels vtx, but not 100% sure.   BP weight and urine results all reviewed and noted. Patient reports good fetal movement, denies any bleeding and no rupture of membranes symptoms or regular contractions.  Fundal Height:  33 Fetal Heart rate:  140 Edema:  no  Patient is without complaints other than noted in her HPI. All questions were answered All lab and sonogram results have been reviewed. Comments:  n/a  Assessment:  1.  Pregnancy at 4949w2d,  Estimated Date of Delivery: 01/05/16 :                          2.  Pelvic pressure, normal for multigravida                        3.  No evidence of PTL  Medication(s) Plans:  Continue methadone 45mg /day per [facility]  Treatment Plan:  Will confirm presnetation at 36 week visit  Return in about 2 weeks (around 11/26/2015) for LROB. for appointment for high risk OB care  No orders of the defined types were placed in this encounter.   Orders Placed This Encounter  Procedures  . POCT urinalysis dipstick

## 2015-11-14 ENCOUNTER — Encounter: Payer: Medicaid Other | Admitting: Obstetrics & Gynecology

## 2015-11-25 ENCOUNTER — Emergency Department (HOSPITAL_COMMUNITY)
Admission: EM | Admit: 2015-11-25 | Discharge: 2015-11-25 | Disposition: A | Discharge status: Home / Self Care | Payer: Medicaid Other | Attending: Emergency Medicine | Admitting: Emergency Medicine

## 2015-11-25 ENCOUNTER — Encounter (HOSPITAL_COMMUNITY): Payer: Self-pay | Admitting: Emergency Medicine

## 2015-11-25 DIAGNOSIS — O99331 Smoking (tobacco) complicating pregnancy, first trimester: Secondary | ICD-10-CM | POA: Insufficient documentation

## 2015-11-25 DIAGNOSIS — R059 Cough, unspecified: Secondary | ICD-10-CM

## 2015-11-25 DIAGNOSIS — R05 Cough: Secondary | ICD-10-CM | POA: Diagnosis not present

## 2015-11-25 DIAGNOSIS — M546 Pain in thoracic spine: Secondary | ICD-10-CM | POA: Diagnosis not present

## 2015-11-25 DIAGNOSIS — Z3A34 34 weeks gestation of pregnancy: Secondary | ICD-10-CM | POA: Diagnosis not present

## 2015-11-25 DIAGNOSIS — O26893 Other specified pregnancy related conditions, third trimester: Secondary | ICD-10-CM | POA: Diagnosis present

## 2015-11-25 DIAGNOSIS — F17210 Nicotine dependence, cigarettes, uncomplicated: Secondary | ICD-10-CM | POA: Diagnosis not present

## 2015-11-25 DIAGNOSIS — Z79899 Other long term (current) drug therapy: Secondary | ICD-10-CM | POA: Diagnosis not present

## 2015-11-25 LAB — URINALYSIS, ROUTINE W REFLEX MICROSCOPIC
BILIRUBIN URINE: NEGATIVE
GLUCOSE, UA: NEGATIVE mg/dL
Hgb urine dipstick: NEGATIVE
KETONES UR: NEGATIVE mg/dL
LEUKOCYTES UA: NEGATIVE
Nitrite: NEGATIVE
PROTEIN: NEGATIVE mg/dL
Specific Gravity, Urine: 1.01 (ref 1.005–1.030)
pH: 6 (ref 5.0–8.0)

## 2015-11-25 MED ORDER — GUAIFENESIN ER 600 MG PO TB12: 600.0000 mg | ORAL_TABLET | Freq: Two times a day (BID) | ORAL | Status: DC

## 2015-11-25 MED ORDER — GUAIFENESIN ER 600 MG PO TB12
ORAL_TABLET | ORAL | Status: AC
  Filled 2015-11-25: qty 1

## 2015-11-25 MED ORDER — ALBUTEROL SULFATE (2.5 MG/3ML) 0.083% IN NEBU: 5.0000 mg | INHALATION_SOLUTION | Freq: Once | RESPIRATORY_TRACT | Status: DC

## 2015-11-25 MED ORDER — ALBUTEROL SULFATE (2.5 MG/3ML) 0.083% IN NEBU
2.5000 mg | INHALATION_SOLUTION | Freq: Once | RESPIRATORY_TRACT | Status: AC
  Administered 2015-11-25: 2.5 mg via RESPIRATORY_TRACT
  Filled 2015-11-25: qty 3

## 2015-11-25 MED ORDER — GUAIFENESIN ER 600 MG PO TB12
600.0000 mg | ORAL_TABLET | Freq: Once | ORAL | Status: AC
  Administered 2015-11-25: 600 mg via ORAL
  Filled 2015-11-25: qty 1

## 2015-11-25 NOTE — ED Notes (Signed)
apologized again for the wait. Respiratory paged for treatment

## 2015-11-25 NOTE — ED Notes (Signed)
Brought patient back into triage and explained delay to patient. Patient verbalized understanding.

## 2015-11-25 NOTE — ED Notes (Addendum)
Patient complaining of coughing up yellow sputum x 1 month with back pain starting yesterday. Patient states she is [redacted] weeks pregnant.

## 2015-11-25 NOTE — ED Provider Notes (Signed)
CSN: [id number]     Arrival date & time 11/25/15  1615 History  By signing my name below, I, [name], attest that this documentation has been prepared under the direction and in the presence of Pricilla Loveless, MD.   Electronically Signed: [name], ED Scribe. 11/25/2015. 9:52 PM   Chief Complaint  Patient presents with  . Cough  . Back Pain with Pregnancy    The history is provided by the patient. No language interpreter was used.   HPI Comments: Alicia Barnett is a 40 y.o. female with PMHx of polysubstance abuse, depression, and anxiety who presents to the Emergency Department complaining of gradual onset, cough, ongoing for one month. Pt reports associated chest pain, back pain, urinary frequency, and mild shortness of breath. No other associated symptoms noted. 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 Pt reports the cough keeps her from sleeping at night. Her back pain is begins after her  No other worsening or alleviating factors noted. Pt denies fevers, chills, vaginal bleeding, dysuria, sneezing, congestion, or any other pertinent symptoms. Pt is currently [redacted] weeks pregnant. Pt is current smoker.   Past Medical History  Diagnosis Date  . Polysubstance abuse   . Depression   . Anxiety    Past Surgical History  Procedure Laterality Date  . No past surgeries    . Leep     Family History  Problem Relation Age of Onset  . Heart failure Mother   . Hypertension Mother   . Cancer Other   . Cancer Maternal Grandmother     lung  . Diabetes Maternal Grandfather   . Cancer Maternal Grandfather     liver  . Asthma Daughter    Social History  Substance Use Topics  . Smoking status: Current Every Day Smoker -- 0.50 packs/day for 23 years    Types: Cigarettes  . Smokeless tobacco: Never Used  . Alcohol Use: No   OB History    Gravida Para Term Preterm AB TAB SAB Ectopic Multiple Living   Review of Systems  Constitutional:  Negative for fever and chills.  HENT: Negative for congestion and sneezing.   Respiratory: Positive for cough and shortness of breath.   Cardiovascular: Positive for chest pain.  Genitourinary: Positive for frequency. Negative for dysuria and vaginal bleeding.  Musculoskeletal: Positive for back pain.  All other systems reviewed and are negative.   Allergies  Codeine  Home Medications   Prior to Admission medications   Medication Sig Start Date End Date Taking? Authorizing Provider  gabapentin (NEURONTIN) 300 MG capsule Take 1 capsule (300 mg total) by mouth 3 (three) times daily. Take one tablet at lunch, two tablets at dinner, and four at bedtime. Patient taking differently: Take 300-1,200 mg by mouth 3 (three) times daily. Take one tablet at lunch, two tablets at dinner, and four at bedtime. 10/07/15  Yes Jacklyn Shell, CNM  methadone (DOLOPHINE) 1 MG/1ML solution Take 115 mg by mouth daily.    Yes Historical Provider, MD  Prenatal Vit-Fe Fumarate-FA (PRENATAL COMPLETE) 14-0.4 MG TABS Take 1 tablet by mouth daily. 05/05/15  Yes Samuel Jester, DO  sertraline (ZOLOFT) 50 MG tablet Take 150 mg by mouth daily.   Yes Historical Provider, MD   BP 107/69 mmHg  Pulse 87  Temp(Src) 98.2 F (36.8 C) (Oral)  Resp 18  Ht  (1.6 m)  Wt 223 lb (  101.152 kg)  BMI 39.51 kg/m2  SpO2 96%  LMP 03/17/2015 (Approximate) Physical Exam  Constitutional: She is oriented to person, place, and time. She appears well-developed and well-nourished.  HENT:  Head: Normocephalic and atraumatic.  Right Ear: External ear normal.  Left Ear: External ear normal.  Nose: Nose normal.  Eyes: Right eye exhibits no discharge. Left eye exhibits no discharge.  Cardiovascular: Normal rate, regular rhythm and normal heart sounds.   Pulmonary/Chest: Effort normal. She has wheezes.  Coarse expiratory wheezes diffusely.   Abdominal: Soft. There is no tenderness.  Gravid  Musculoskeletal:  No  reproducible back tenderness  Neurological: She is alert and oriented to person, place, and time.  Skin: Skin is warm and dry.  Nursing note and vitals reviewed.   ED Course  Procedures (including critical care time) DIAGNOSTIC STUDIES: Oxygen Saturation is 96% on RA, normal by my interpretation.    COORDINATION OF CARE: 9:52 PM Discussed treatment plan with pt at bedside and pt agreed to plan.  Labs Review Labs Reviewed  URINE CULTURE  URINALYSIS, ROUTINE W REFLEX MICROSCOPIC (NOT AT [facility])    Imaging Review No results found. I have personally reviewed and evaluated these images and lab results as part of my medical decision-making.   EKG Interpretation None      MDM   Final diagnoses:  Cough  Bilateral thoracic back pain    Patient with cough x 1 month. Has prior history of wheezing. No focal abnormalities or unilateral symptoms. No fevers or significant dyspnea to suggest PNA. I don't think CXR is needed, especially with her being pregnant. Will give albuterol. Her back pain sounds MSK from coughing x 1 month. She is not having abd pain or contractions. No signs of UTI. Discussed with her OB, Dr. Emelda FearFerguson, who recommends humidifier and guaifenesin (she is allergic to codeine). She has an appointment with her OB tomorrow. D/c with return precautions.  I personally performed the services described in this documentation, which was scribed in my presence. The recorded information has been reviewed and is accurate.     Pricilla LovelessScott Dencil Cayson, MD 11/26/15 [phone]

## 2015-11-25 NOTE — ED Notes (Signed)
Patient walked to the bathroom with minimal assistance.  

## 2015-11-26 ENCOUNTER — Ambulatory Visit (INDEPENDENT_AMBULATORY_CARE_PROVIDER_SITE_OTHER): Payer: Medicaid Other | Admitting: Advanced Practice Midwife

## 2015-11-26 VITALS — BP 100/68 | HR 94 | Wt 225.0 lb

## 2015-11-26 DIAGNOSIS — F1110 Opioid abuse, uncomplicated: Secondary | ICD-10-CM | POA: Diagnosis not present

## 2015-11-26 DIAGNOSIS — Z331 Pregnant state, incidental: Secondary | ICD-10-CM | POA: Diagnosis not present

## 2015-11-26 DIAGNOSIS — F1111 Opioid abuse, in remission: Secondary | ICD-10-CM

## 2015-11-26 DIAGNOSIS — Z3A35 35 weeks gestation of pregnancy: Secondary | ICD-10-CM

## 2015-11-26 DIAGNOSIS — Z1389 Encounter for screening for other disorder: Secondary | ICD-10-CM | POA: Diagnosis not present

## 2015-11-26 DIAGNOSIS — O99323 Drug use complicating pregnancy, third trimester: Secondary | ICD-10-CM

## 2015-11-26 DIAGNOSIS — O09893 Supervision of other high risk pregnancies, third trimester: Secondary | ICD-10-CM

## 2015-11-26 DIAGNOSIS — O99333 Smoking (tobacco) complicating pregnancy, third trimester: Secondary | ICD-10-CM | POA: Diagnosis not present

## 2015-11-26 LAB — POCT URINALYSIS DIPSTICK
Blood, UA: NEGATIVE
GLUCOSE UA: NEGATIVE
Ketones, UA: NEGATIVE
LEUKOCYTES UA: NEGATIVE
NITRITE UA: NEGATIVE

## 2015-11-26 MED ORDER — NICOTINE 21 MG/24HR TD PT24: 21.0000 mg | MEDICATED_PATCH | Freq: Every day | TRANSDERMAL | Status: DC
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
## 2015-11-27 LAB — URINE CULTURE

## 2015-11-29 ENCOUNTER — Emergency Department (HOSPITAL_COMMUNITY): Payer: Medicaid Other

## 2015-11-29 ENCOUNTER — Encounter (HOSPITAL_COMMUNITY): Payer: Self-pay

## 2015-11-29 ENCOUNTER — Emergency Department (HOSPITAL_COMMUNITY)
Admission: EM | Admit: 2015-11-29 | Discharge: 2015-11-29 | Disposition: A | Discharge status: Other Acute Inpatient Hospital | Payer: Medicaid Other | Attending: Obstetrics & Gynecology | Admitting: Obstetrics & Gynecology

## 2015-11-29 DIAGNOSIS — R0902 Hypoxemia: Secondary | ICD-10-CM | POA: Diagnosis not present

## 2015-11-29 DIAGNOSIS — Z3A35 35 weeks gestation of pregnancy: Secondary | ICD-10-CM | POA: Insufficient documentation

## 2015-11-29 DIAGNOSIS — F17210 Nicotine dependence, cigarettes, uncomplicated: Secondary | ICD-10-CM | POA: Insufficient documentation

## 2015-11-29 DIAGNOSIS — O99513 Diseases of the respiratory system complicating pregnancy, third trimester: Secondary | ICD-10-CM | POA: Diagnosis present

## 2015-11-29 DIAGNOSIS — F329 Major depressive disorder, single episode, unspecified: Secondary | ICD-10-CM | POA: Diagnosis not present

## 2015-11-29 DIAGNOSIS — O99333 Smoking (tobacco) complicating pregnancy, third trimester: Secondary | ICD-10-CM | POA: Insufficient documentation

## 2015-11-29 LAB — URINALYSIS, ROUTINE W REFLEX MICROSCOPIC
Glucose, UA: NEGATIVE mg/dL
Glucose, UA: NEGATIVE mg/dL
HGB URINE DIPSTICK: NEGATIVE
HGB URINE DIPSTICK: NEGATIVE
KETONES UR: 40 mg/dL — AB
Ketones, ur: >80 mg/dL — AB
NITRITE: NEGATIVE
Nitrite: NEGATIVE
PROTEIN: 30 mg/dL — AB
Protein, ur: 30 mg/dL — AB
Specific Gravity, Urine: 1.025 (ref 1.005–1.030)
pH: 6 (ref 5.0–8.0)
pH: 6 (ref 5.0–8.0)

## 2015-11-29 LAB — COMPREHENSIVE METABOLIC PANEL
ALBUMIN: 2.5 g/dL — AB (ref 3.5–5.0)
ALT: 9 U/L — ABNORMAL LOW (ref 14–54)
ANION GAP: 10 (ref 5–15)
AST: 25 U/L (ref 15–41)
Alkaline Phosphatase: 160 U/L — ABNORMAL HIGH (ref 38–126)
BUN: 9 mg/dL (ref 6–20)
CHLORIDE: 104 mmol/L (ref 101–111)
CO2: 21 mmol/L — AB (ref 22–32)
Calcium: 8.4 mg/dL — ABNORMAL LOW (ref 8.9–10.3)
Creatinine, Ser: 0.77 mg/dL (ref 0.44–1.00)
GFR calc non Af Amer: >60 mL/min (ref >60)
GLUCOSE: 86 mg/dL (ref 65–99)
Potassium: 3.7 mmol/L (ref 3.5–5.1)
SODIUM: 135 mmol/L (ref 135–145)
Total Bilirubin: 0.4 mg/dL (ref 0.3–1.2)
Total Protein: 6.8 g/dL (ref 6.5–8.1)

## 2015-11-29 LAB — URINE MICROSCOPIC-ADD ON

## 2015-11-29 LAB — BRAIN NATRIURETIC PEPTIDE: B NATRIURETIC PEPTIDE 5: 156 pg/mL — AB (ref 0.0–100.0)

## 2015-11-29 LAB — CBC WITH DIFFERENTIAL/PLATELET
Basophils Absolute: 0 10*3/uL (ref 0.0–0.1)
Basophils Relative: 0 %
Eosinophils Absolute: 0.5 10*3/uL (ref 0.0–0.7)
Eosinophils Relative: 4 %
HEMATOCRIT: 28.3 % — AB (ref 36.0–46.0)
HEMOGLOBIN: 9.4 g/dL — AB (ref 12.0–15.0)
LYMPHS ABS: 1.3 10*3/uL (ref 0.7–4.0)
LYMPHS PCT: 10 %
MCH: 29 pg (ref 26.0–34.0)
MCHC: 33.2 g/dL (ref 30.0–36.0)
MCV: 87.3 fL (ref 78.0–100.0)
MONOS PCT: 3 %
Monocytes Absolute: 0.4 10*3/uL (ref 0.1–1.0)
NEUTROS ABS: 10.4 10*3/uL — AB (ref 1.7–7.7)
NEUTROS PCT: 83 %
Platelets: 202 10*3/uL (ref 150–400)
RBC: 3.24 MIL/uL — ABNORMAL LOW (ref 3.87–5.11)
RDW: 15.6 % — ABNORMAL HIGH (ref 11.5–15.5)
WBC: 12.6 10*3/uL — ABNORMAL HIGH (ref 4.0–10.5)

## 2015-11-29 LAB — TROPONIN I: Troponin I: <0.03 ng/mL (ref <0.031)

## 2015-11-29 MED ORDER — IOPAMIDOL (ISOVUE-370) INJECTION 76%
100.0000 mL | Freq: Once | INTRAVENOUS | Status: AC | PRN
  Administered 2015-11-29: 100 mL via INTRAVENOUS

## 2015-11-29 MED ORDER — IPRATROPIUM-ALBUTEROL 0.5-2.5 (3) MG/3ML IN SOLN
3.0000 mL | Freq: Once | RESPIRATORY_TRACT | Status: AC
  Administered 2015-11-29: 3 mL via RESPIRATORY_TRACT
  Filled 2015-11-29: qty 3

## 2015-11-29 MED ORDER — SODIUM CHLORIDE 0.9 % IV BOLUS (SEPSIS): 1000.0000 mL | Freq: Once | INTRAVENOUS | Status: DC

## 2015-11-29 MED ORDER — ALBUTEROL SULFATE (2.5 MG/3ML) 0.083% IN NEBU
2.5000 mg | INHALATION_SOLUTION | Freq: Once | RESPIRATORY_TRACT | Status: AC
  Administered 2015-11-29: 2.5 mg via RESPIRATORY_TRACT
  Filled 2015-11-29: qty 3

## 2015-11-29 MED ORDER — SODIUM CHLORIDE 0.9 % IV SOLN
INTRAVENOUS | Status: DC
  Administered 2015-11-29: 13:00:00 via INTRAVENOUS

## 2015-11-29 NOTE — ED Notes (Signed)
Rapid response for monitoring notified

## 2015-11-29 NOTE — Progress Notes (Signed)
Pt ambulated to restroom on RA, upon returning to room SpO2 was 69%. RN reapplied 4LNC, SpO2 now 95%. RT will continue to monitor.

## 2015-11-29 NOTE — Progress Notes (Signed)
Dr. Adrian BlackwaterStinson on unit. Reviewed FHR tracing. Says they will call him with the results of the pt's chest x-ray.

## 2015-11-29 NOTE — Progress Notes (Signed)
FHR not tracing for 3 min. Marchelle FolksAmanda RN notified. Says she will adjust FM.

## 2015-11-29 NOTE — Progress Notes (Signed)
Report given to Katie Forsell RNC-OB 

## 2015-11-29 NOTE — Progress Notes (Signed)
Dr. Adrian BlackwaterStinson notified of pt's status and asked to call [facility] to check on pt. Says he will give them a call.

## 2015-11-29 NOTE — Progress Notes (Signed)
Spoke with Zella Ballobin, RN. Asked her to adjust pt's toco because it is not tracing.

## 2015-11-29 NOTE — ED Notes (Signed)
Pt transported by [facility] to [facility]

## 2015-11-29 NOTE — ED Notes (Signed)
Pt transporting to CT at this time. Mary RN at [facility] made aware.

## 2015-11-29 NOTE — ED Notes (Signed)
Alicia AlcideMelinda, RN called [facility] and pt has been placed on their list for transport as well. If [facility] transports [facility] to [facility], please call and cancel [facility].

## 2015-11-29 NOTE — Progress Notes (Signed)
Received from [facility] RN, Zella Ballobin. Pt is a G6P3 at 34 5/[redacted] weeks gestation with c/o cough and shortness of breath. Says pt's 02 sat on adm was 80%. Says now it is 96% on 4 1/2 liters of 02. They have given her several nebulizer treatments and are getting ready to do a chest x-ray. Says pt denies vaginal bleeding or leaking of fluid. [facility] Says they are giving her fluids.

## 2015-11-29 NOTE — ED Notes (Signed)
[facility] to transport pt after shift change per Kathie RhodesBetty, Licensed conveyancerunit secretary.

## 2015-11-29 NOTE — ED Notes (Signed)
Spoke with Carollee HerterShannon at L&D triage. Report was given/ awaiting [facility]

## 2015-11-29 NOTE — ED Notes (Signed)
Per Rodena GoldmannMatt Daniels, [facility] states that they are to busy for transports at this time.

## 2015-11-29 NOTE — ED Notes (Signed)
Patient placed on 2 lpm of oxygen, sats increased to 90%.

## 2015-11-29 NOTE — ED Notes (Signed)
[facility] states that they won't be able to transport pt until after 11pm.

## 2015-11-29 NOTE — ED Notes (Signed)
O2 at 4.5L/min. Sats 93 %

## 2015-11-29 NOTE — ED Notes (Signed)
Pt returned from CT. Notified Mary, [facility] at [facility] pt hooked back to [place] machine. Pt resting in bed with nad noted. cb at side.
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
## 2015-11-29 NOTE — ED Notes (Signed)
Patient reports of cough, feeling short of breath, and pain in mid back into chest. States she has been seen and taken several OTC medications with no relief. Currently [redacted] weeks pregnant.

## 2015-11-29 NOTE — ED Notes (Signed)
Oxygen increased to 6 lpm. Sats increased to 94%.

## 2015-11-29 NOTE — Progress Notes (Signed)
Dr. Adrian BlackwaterStinson on unit. Reviewed  FHR tracing. Talking with ED MD about plan of care.

## 2015-11-29 NOTE — Progress Notes (Addendum)
Spoke with Dr. Adrian BlackwaterStinson. FHR tracing has a baseline of 135, 125, min variability, 10x10 accel, one variable decel. Toco not tracing. Says he will call and speak to the ED MD.

## 2015-11-29 NOTE — ED Notes (Signed)
Dr. Clarene DukeMcManus made aware of patient.

## 2015-11-29 NOTE — ED Notes (Signed)
Report given to Lori, RN

## 2015-11-30 DIAGNOSIS — F1120 Opioid dependence, uncomplicated: Secondary | ICD-10-CM | POA: Insufficient documentation

## 2015-12-01 LAB — URINE CULTURE: Culture: NO GROWTH

## 2015-12-09 ENCOUNTER — Other Ambulatory Visit: Payer: Self-pay | Admitting: Advanced Practice Midwife

## 2015-12-09 DIAGNOSIS — F1111 Opioid abuse, in remission: Secondary | ICD-10-CM

## 2015-12-09 DIAGNOSIS — O09893 Supervision of other high risk pregnancies, third trimester: Secondary | ICD-10-CM

## 2015-12-10 ENCOUNTER — Other Ambulatory Visit: Payer: Medicaid Other

## 2015-12-10 ENCOUNTER — Encounter: Payer: Medicaid Other | Admitting: Obstetrics & Gynecology

## 2015-12-16 ENCOUNTER — Encounter: Payer: Self-pay | Admitting: Obstetrics and Gynecology

## 2015-12-16 ENCOUNTER — Ambulatory Visit (INDEPENDENT_AMBULATORY_CARE_PROVIDER_SITE_OTHER): Payer: Medicaid Other | Admitting: Obstetrics and Gynecology

## 2015-12-16 VITALS — BP 100/70 | HR 114 | Wt 229.0 lb

## 2015-12-16 DIAGNOSIS — O09893 Supervision of other high risk pregnancies, third trimester: Secondary | ICD-10-CM

## 2015-12-16 DIAGNOSIS — F1110 Opioid abuse, uncomplicated: Secondary | ICD-10-CM | POA: Diagnosis not present

## 2015-12-16 DIAGNOSIS — Z1389 Encounter for screening for other disorder: Secondary | ICD-10-CM

## 2015-12-16 DIAGNOSIS — O99323 Drug use complicating pregnancy, third trimester: Secondary | ICD-10-CM

## 2015-12-16 DIAGNOSIS — Z331 Pregnant state, incidental: Secondary | ICD-10-CM | POA: Diagnosis not present

## 2015-12-16 DIAGNOSIS — O133 Gestational [pregnancy-induced] hypertension without significant proteinuria, third trimester: Secondary | ICD-10-CM | POA: Diagnosis not present

## 2015-12-16 DIAGNOSIS — Z3A39 39 weeks gestation of pregnancy: Secondary | ICD-10-CM

## 2015-12-16 LAB — POCT URINALYSIS DIPSTICK
Blood, UA: NEGATIVE
GLUCOSE UA: NEGATIVE
Ketones, UA: NEGATIVE
Leukocytes, UA: NEGATIVE
Nitrite, UA: NEGATIVE
Protein, UA: NEGATIVE

## 2015-12-16 NOTE — Progress Notes (Signed)
Pt here today to discuss continuing her care here. Pt wants to make sure Dr. Emelda FearFerguson will continue seeing her and deliver her baby.

## 2015-12-17 LAB — PMP SCREEN PROFILE (10S), URINE
Amphetamine Screen, Ur: NEGATIVE ng/mL
Barbiturate Screen, Ur: NEGATIVE ng/mL
Benzodiazepine Screen, Urine: POSITIVE ng/mL
Cannabinoids Ur Ql Scn: NEGATIVE ng/mL
Cocaine(Metab.)Screen, Urine: NEGATIVE ng/mL
Creatinine(Crt), U: 99.7 mg/dL (ref 20.0–300.0)
Methadone Scn, Ur: POSITIVE ng/mL
Opiate Scrn, Ur: NEGATIVE ng/mL
Oxycodone+Oxymorphone Ur Ql Scn: NEGATIVE ng/mL
PCP Scrn, Ur: NEGATIVE ng/mL
Ph of Urine: 6.4 (ref 4.5–8.9)
Propoxyphene, Screen: NEGATIVE ng/mL

## 2015-12-18 ENCOUNTER — Other Ambulatory Visit: Payer: Medicaid Other

## 2015-12-18 ENCOUNTER — Encounter: Payer: Medicaid Other | Admitting: Obstetrics & Gynecology

## 2015-12-27 IMAGING — DX DG CHEST 2V
2 series · 2 of 2 positions shown · non-contrast
Comparison: None.

CLINICAL DATA: Productive cough for 1 week

EXAM:
CHEST  2 VIEW

[chest pa]
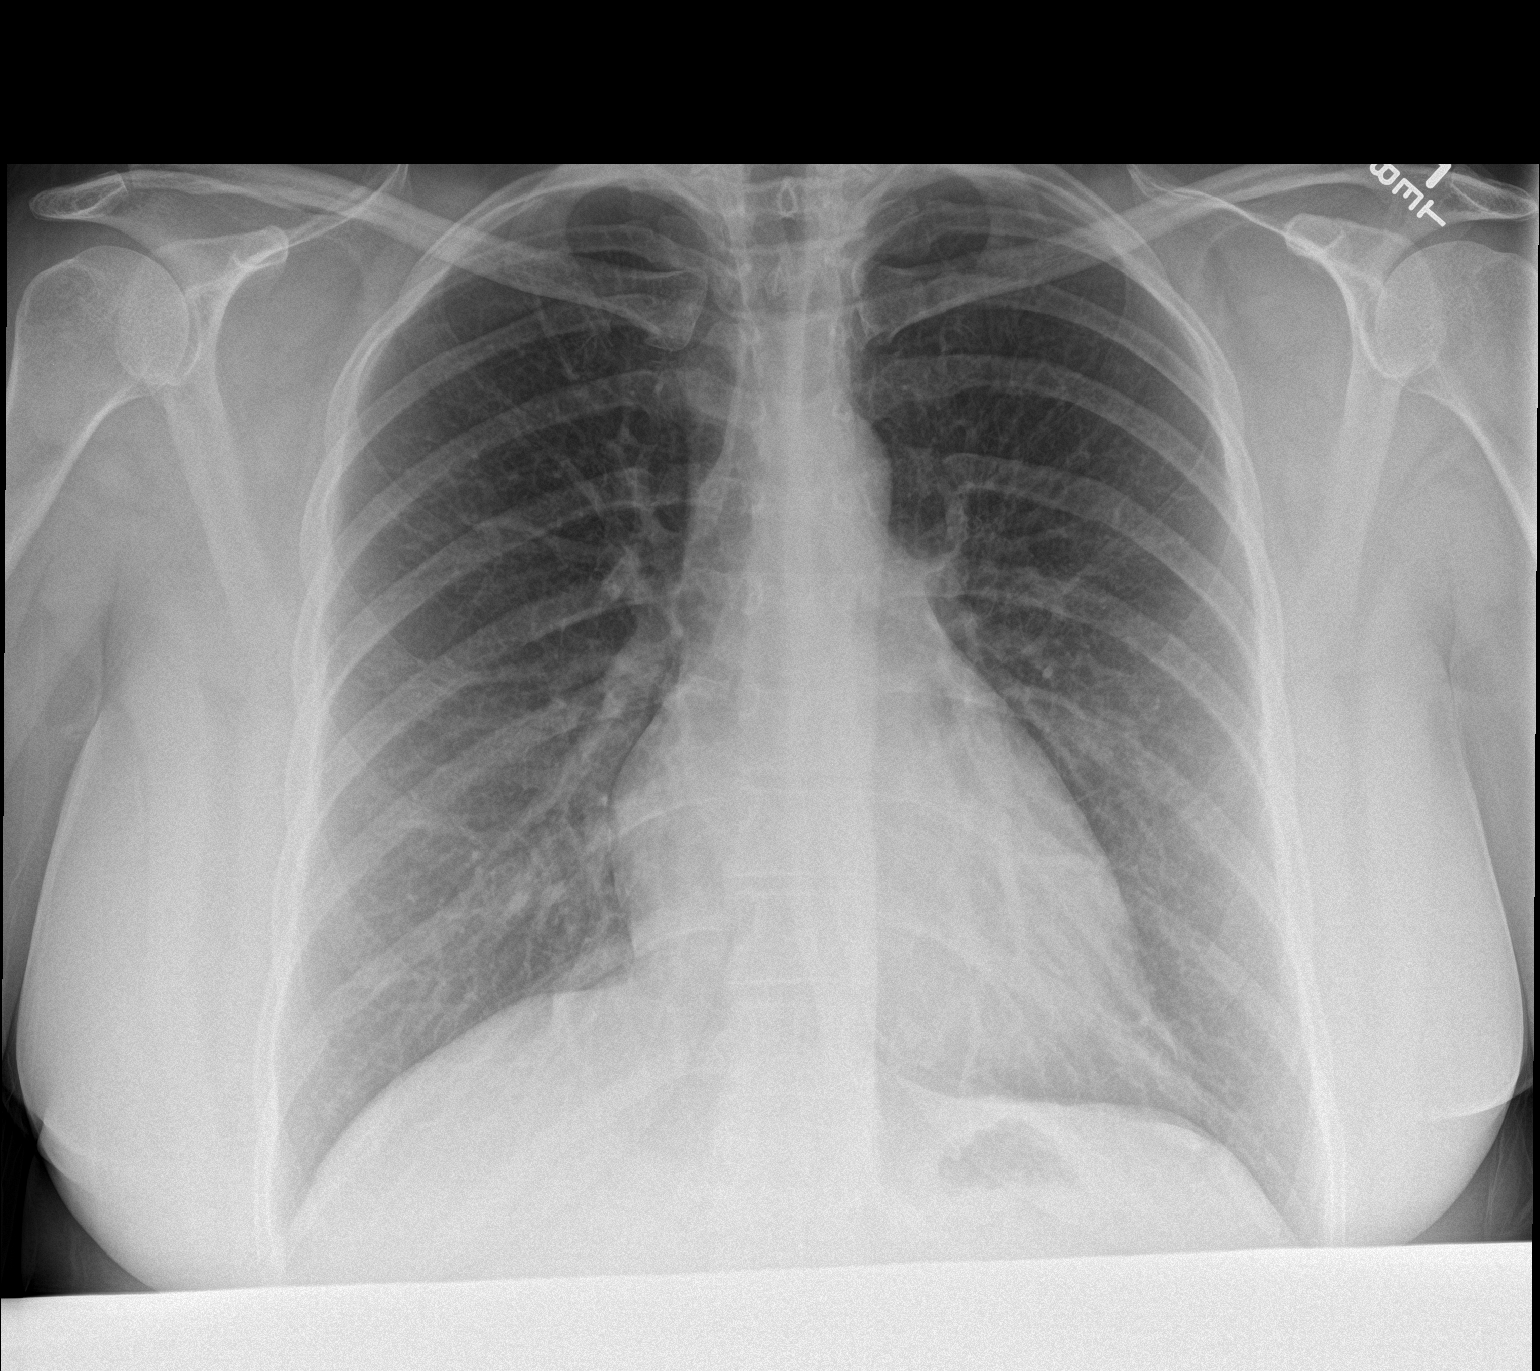

[chest lat]
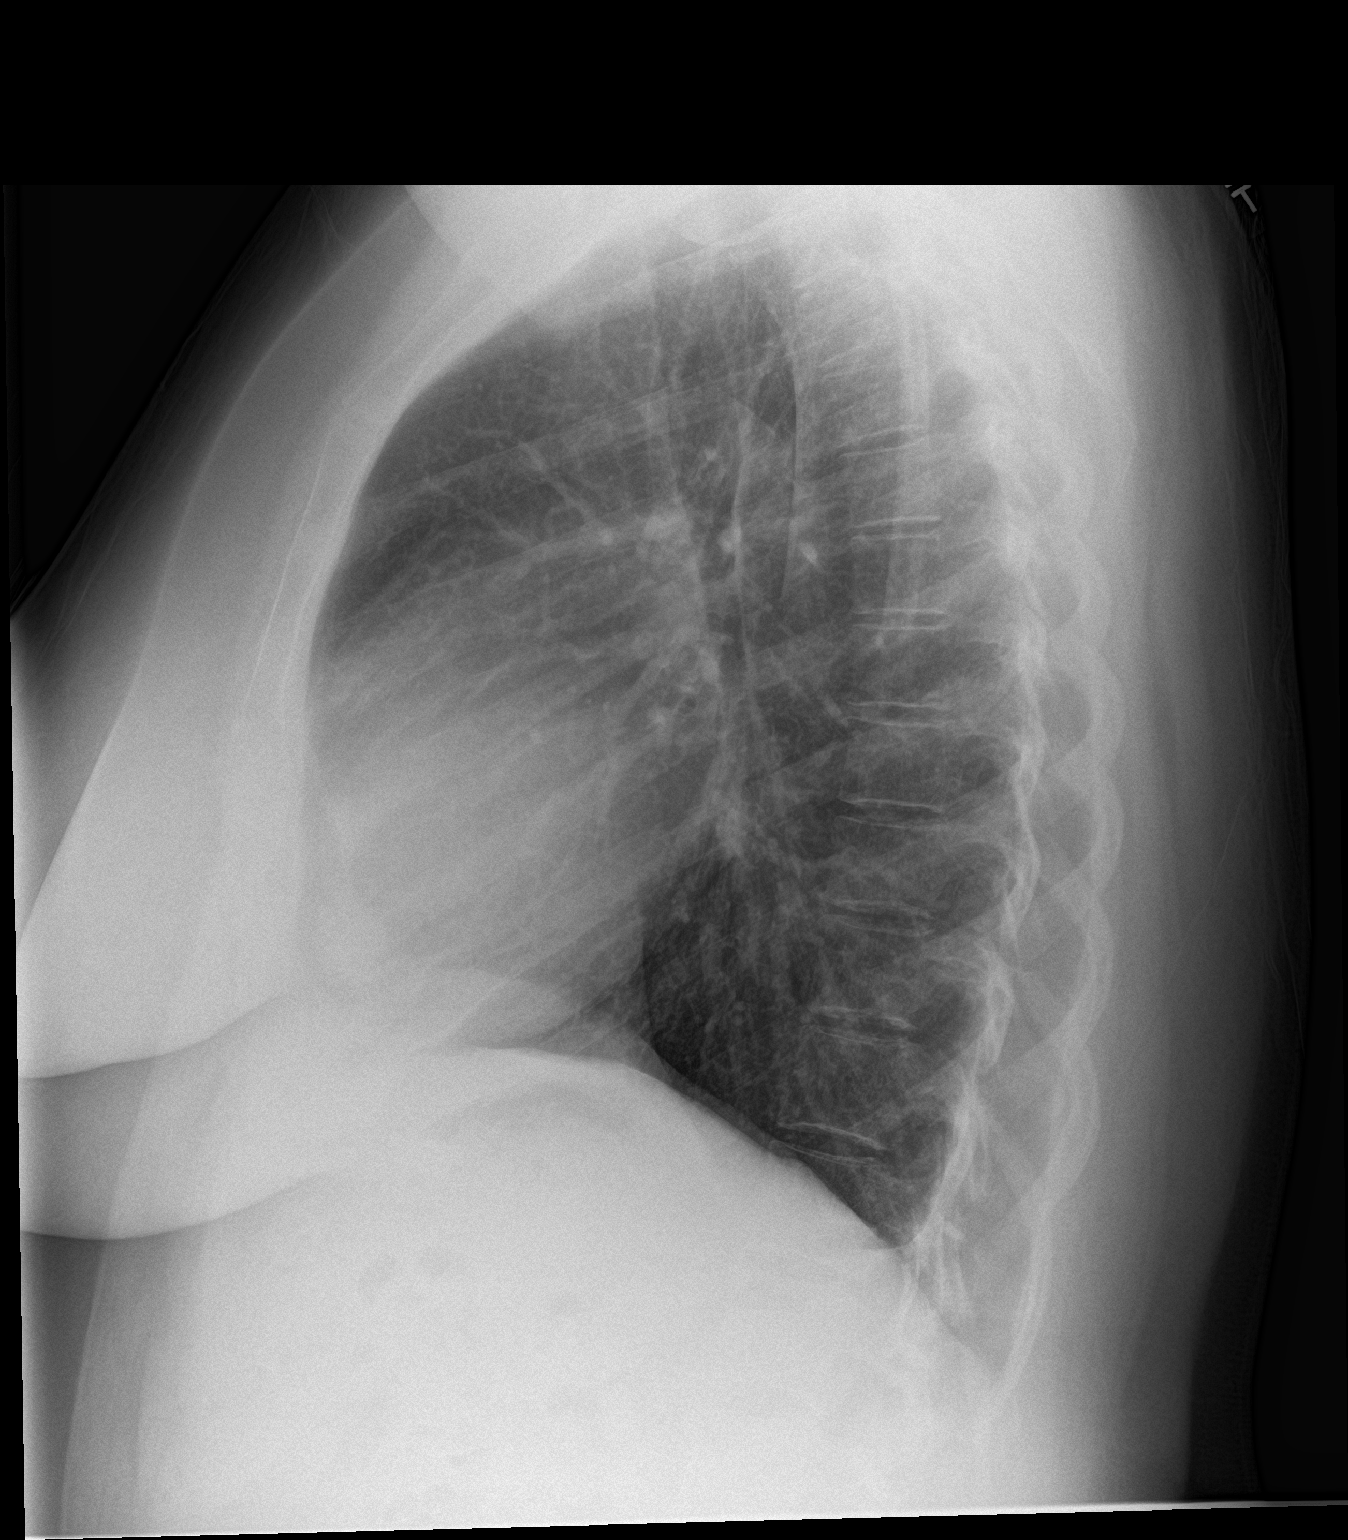

[2 of 2 positions shown; findings below may reference images not displayed]

FINDINGS: There is mild hyperinflation. Cardiac silhouette is upper normal in
size. Hilar and mediastinal contours are unremarkable. The lungs are
clear. There are no pleural effusions.
IMPRESSION: Mild hyperinflation.

## 2016-01-04 NOTE — Progress Notes (Signed)
High Risk Pregnancy HROB Diagnosis(es):  Pulmonary hypertension,  [id number] Estimated Date of Delivery: 01/05/16    HPI: The patient is being seen today for ongoing management of pregnancy asthma, recently dx'd pulmonary hypertenison. Today she reports she still has dyspnea with exertion. [facility], and has a planned visit there for continuation of pregnancy care, and is here to see if we will manage her care instead of [facility]. Patient reports good fetal movement, denies any bleeding and no rupture of membranes symptoms or regular contractions.   BP weight and urine results reviewed and noted. Blood pressure 100/70, pulse 114, weight 229 lb (103.874 kg), last menstrual period 03/17/2015.  Fundal Height:  term Fetal Heart rate:  145 Physical Examination: Abdomen - soft, nontender, nondistended, no masses or organomegaly                                      Pelvic -                                      Edema:  minimal  Urinalysis:NEGATIVE for protein                 POSITIVE for   Fetal Surveillance Testing today:  none  Lab and sonogram results have been reviewed. Comments:  normal  Assessment:  1.  Pregnancy at 7331w6d,  [id number]   :  Recent acute lung illnes, with Pulmonary HTN per [facility] eval                        2.  Will encourage pt to keep her appt as shcedule w/i a week for forllow up of recent hospialization                        3.   Medication(s) Plans:  albuterol  Treatment Plan:  Per [facility].  Follow up in prn weeks for appointment for high risk OB care, would allow [facility] to manage remainder of preganany, and IOL

## 2016-01-12 ENCOUNTER — Telehealth: Payer: Self-pay | Admitting: Obstetrics and Gynecology

## 2016-01-12 NOTE — Telephone Encounter (Signed)
Spoke with pt. Pt states she needs a refill on Zoloft. Pt has run out. Pt has delivered and baby is still in hospital. Please advise. Thanks!! JSY

## 2016-01-13 ENCOUNTER — Telehealth: Payer: Self-pay | Admitting: Obstetrics & Gynecology

## 2016-01-13 MED ORDER — SERTRALINE HCL 50 MG PO TABS: 150.0000 mg | ORAL_TABLET | Freq: Every day | ORAL | Status: DC

## 2016-01-21 ENCOUNTER — Ambulatory Visit (INDEPENDENT_AMBULATORY_CARE_PROVIDER_SITE_OTHER): Payer: Medicaid Other | Admitting: Obstetrics and Gynecology

## 2016-01-21 ENCOUNTER — Encounter: Payer: Self-pay | Admitting: Obstetrics and Gynecology

## 2016-01-21 DIAGNOSIS — Z302 Encounter for sterilization: Secondary | ICD-10-CM | POA: Diagnosis not present

## 2016-01-21 DIAGNOSIS — F1111 Opioid abuse, in remission: Secondary | ICD-10-CM

## 2016-01-21 DIAGNOSIS — F1110 Opioid abuse, uncomplicated: Secondary | ICD-10-CM

## 2016-01-21 DIAGNOSIS — Z01818 Encounter for other preprocedural examination: Secondary | ICD-10-CM

## 2016-01-21 DIAGNOSIS — M545 Low back pain: Secondary | ICD-10-CM | POA: Diagnosis not present

## 2016-01-21 MED ORDER — NORETHINDRONE 0.35 MG PO TABS: 1.0000 | ORAL_TABLET | Freq: Every day | ORAL | Status: DC

## 2016-01-21 MED ORDER — OXYCODONE-ACETAMINOPHEN 5-325 MG PO TABS: 1.0000 | ORAL_TABLET | Freq: Two times a day (BID) | ORAL | Status: DC
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
## 2016-01-21 NOTE — Progress Notes (Signed)
Patient ID: Alicia Barnett, female   DOB: Apr 28, 1976, 40 y.o.   MRN: [id number] Pt here today for contraception and for a possible pinched nerve on her left side. Pt states that she has had numbness and tingling in her left arm.

## 2016-02-02 ENCOUNTER — Telehealth: Payer: Self-pay | Admitting: *Deleted

## 2016-02-02 DIAGNOSIS — F1111 Opioid abuse, in remission: Secondary | ICD-10-CM

## 2016-02-02 DIAGNOSIS — F418 Other specified anxiety disorders: Secondary | ICD-10-CM

## 2016-02-02 MED ORDER — GABAPENTIN 300 MG PO CAPS: 300.0000 mg | ORAL_CAPSULE | Freq: Three times a day (TID) | ORAL | 3 refills | Status: DC

## 2016-02-02 NOTE — Telephone Encounter (Signed)
Pt states needs refill on Gabapentin. Pt states has been taking 7 tablets a day and would like to decrease to half of what she has been taking.   Pt has an appt for 02/04/2016 with Dr.Ferguson but states she will take her last tablet of Gabapentin today.

## 2016-02-02 NOTE — Telephone Encounter (Signed)
Apothecary notified of Rx reduction to 1200 mg/d with 3 refils.

## 2016-02-03 ENCOUNTER — Encounter: Payer: Medicaid Other | Admitting: Obstetrics and Gynecology

## 2016-02-03 NOTE — Telephone Encounter (Signed)
I Escribed a reduced dose of 1200 mg /day last nite, please notify pt.

## 2016-02-04 ENCOUNTER — Encounter: Payer: Self-pay | Admitting: Obstetrics and Gynecology

## 2016-02-04 ENCOUNTER — Ambulatory Visit (INDEPENDENT_AMBULATORY_CARE_PROVIDER_SITE_OTHER): Payer: Medicaid Other | Admitting: Obstetrics and Gynecology

## 2016-02-04 DIAGNOSIS — M79622 Pain in left upper arm: Secondary | ICD-10-CM

## 2016-02-04 DIAGNOSIS — F1110 Opioid abuse, uncomplicated: Secondary | ICD-10-CM

## 2016-02-04 DIAGNOSIS — Z3009 Encounter for other general counseling and advice on contraception: Secondary | ICD-10-CM | POA: Diagnosis not present

## 2016-02-04 MED ORDER — HYDROCHLOROTHIAZIDE 25 MG PO TABS
25.0000 mg | ORAL_TABLET | Freq: Every day | ORAL | 3 refills | Status: DC
Start: 2016-02-04 — End: 2016-02-16

## 2016-02-04 MED ORDER — OXYCODONE-ACETAMINOPHEN 5-325 MG PO TABS: 1.0000 | ORAL_TABLET | Freq: Two times a day (BID) | ORAL | 0 refills | Status: DC

## 2016-02-04 NOTE — Telephone Encounter (Signed)
Pt advised that she will need to go to [facility] where she was staying with her baby, to collect her unused bottle of Neurontin, as Medicaid will not refil her meds.

## 2016-02-04 NOTE — Progress Notes (Addendum)
Patient ID: Alicia Barnett, female   DOB: 15-Jul-1975, 40 y.o.   MRN: [id number]  Preoperative History and Physical  Alicia Barnett is a 40 y.o. [id number] here for surgical management of permanent sterilization.   No significant preoperative concerns. Pt is no longer breastfeeding.   Pt is currently taking Methadone, managed by [facility], as well as Zoloft and Gabapentin, managed by this office.   Pt states she ran out of Gabapentin 2 days ago and has been experiencing increased left arm pain and paresthesia from baseline. She reports that lying on her left side and sitting in certain positions worsens her pain. She states taking Percocet provides her 6 hours of relief. Per pt, she has been unable to pick up her refill because the pharmacy has been requesting authorization from the office.   Discussed with pt risks and benefits of BTL. Discussed using clips only vs bilateral salpingectomy. Advised pt that bilateral salpingectomy is a permanent procedure, but reduces the risk of tube/ovary cancer by 2/3, from 1/100 lifetime to 1/300 lifetime risk At end of discussion, pt had opportunity to ask questions and has no further questions at this time.   Greater than 50% was spent in counseling and coordination of care with the patient. Total time greater than: 15 minutes    Proposed surgery: BTL, a.k.a  bilateral salpingectomy  Past Medical History:  Diagnosis Date   Anxiety    Depression    Polysubstance abuse    Past Surgical History:  Procedure Laterality Date   LEEP     NO PAST SURGERIES     OB History  Gravida Para Term Preterm AB Living  SAB TAB Ectopic Multiple Live Births    2     3    # Outcome Date GA Lbr Len/2nd Weight Sex Delivery Anes PTL Lv  6 Gravida           5 Term 11/05/97 [redacted]w[redacted]d  7 lb 7 oz (3.374 kg) F Vag-Spont  Y LIV  4 Term 10/03/96 [redacted]w[redacted]d  8 lb 2 oz (3.685 kg) F Vag-Spont EPI N LIV  3 Term 02/02/94 [redacted]w[redacted]d  7 lb 13 oz (3.544 kg) F Vag-Spont  N LIV  2 TAB            1 TAB             Patient denies any other pertinent gynecologic issues.   Current Outpatient Prescriptions on File Prior to Visit  Medication Sig Dispense Refill   gabapentin (NEURONTIN) 300 MG capsule Take 1-4 capsules (300-1,200 mg total) by mouth 3 (three) times daily. Take one tablet at lunch, one tablet at dinner, and 2 at bedtime. 120 capsule 3   methadone (DOLOPHINE) 1 MG/1ML solution Take 115 mg by mouth daily.      norethindrone (MICRONOR,CAMILA,ERRIN) 0.35 MG tablet Take 1 tablet (0.35 mg total) by mouth daily. 1 Package 2   oxyCODONE-acetaminophen (PERCOCET/ROXICET) 5-325 MG tablet Take 1 tablet by mouth 2 (two) times daily. 28 tablet 0   sertraline (ZOLOFT) 50 MG tablet Take 3 tablets (150 mg total) by mouth daily. 90 tablet 3   No current facility-administered medications on file prior to visit.    Allergies  Allergen Reactions   Codeine Hives    Social History:   reports that she has been smoking Cigarettes.  She has a 11.50 pack-year smoking history. She has never used smokeless tobacco. She reports that she does not drink alcohol or  use drugs.  Family History  Problem Relation Age of Onset   Heart failure Mother    Hypertension Mother    Cancer Maternal Grandmother     lung   Diabetes Maternal Grandfather    Cancer Maternal Grandfather     liver   Asthma Daughter    Cancer Other     Review of Systems: Noncontributory  PHYSICAL EXAM: Blood pressure 120/80, height 5\' 3"  (1.6 m), weight 208 lb (94.3 kg), currently breastfeeding. General appearance - alert, well appearing, and in no distress Chest - clear to auscultation, no wheezes, rales or rhonchi, symmetric air entry Heart - normal rate and regular rhythm Abdomen - soft, nontender, nondistended, no masses or organomegaly Pelvic exam:  VULVA: normal appearing vulva with no masses, tenderness or lesions,  VAGINA: normal appearing vagina with normal color and discharge, no lesions,   CERVIX: normal appearing cervix without discharge or lesions,  UTERUS: uterus is normal size, shape, consistency and nontender, appropriate size, first degree uterine descensus  ADNEXA: normal adnexa in size, nontender and no masses.  Extremities - peripheral pulses normal, no pedal edema, no clubbing or cyanosis  Labs: No results found for this or any previous visit (from the past 336 hour(s)).  Imaging Studies: No results found.  Assessment: Patient Active Problem List   Diagnosis Date Noted   Postpartum care following vaginal delivery 01/21/2016   Rubella non-immune status, antepartum 09/10/2015   Supervision of other high-risk pregnancy 06/17/2015   History of polysubstance abuse 06/17/2015   Depression with anxiety 06/17/2015   History of LEEP (loop electrosurgical excision procedure) of cervix complicating pregnancy 06/17/2015   Smoker 06/17/2015    Plan: Patient will undergo surgical management with BTL on 02/13/16.  Will consult pharmacy for issues picking up Gabapentin : pt will need to go back [facility]   [facility] to pick up meds from where she stayed with the baby, as Medicaid will NOT refil the medicine early F/u for post op in 3 weeks Advised shoulder exercises to alleviate arm pain  Advised pt to pursue primary care with the new physician joining Dr. Anthony Sar office Will rx HCTZ and bid 5 mg Percocet x 28 tablets     .mec 02/04/2016 2:33 PM    By signing my name below, I, Alicia Barnett, attest that this documentation has been prepared under the direction and in the presence of Tilda Burrow, MD. Electronically Signed: Doreatha Barnett, ED Scribe. 02/04/16. 3:18 PM.  I personally performed the services described in this documentation, which was SCRIBED in my presence. The recorded information has been reviewed and considered accurate. It has been edited as necessary during review. Tilda Burrow, MD

## 2016-02-04 NOTE — Telephone Encounter (Signed)
Pt informed Gabapentin 1200 mg e-scribed to pharmacy per Dr. Emelda Fear.

## 2016-02-06 NOTE — Patient Instructions (Signed)
Alicia Barnett  02/06/2016     @PREFPERIOPPHARMACY @   Your procedure is scheduled on 02/13/2016.  Report to [facility] at 6:15 A.M.  Call this number if you have problems the morning of surgery:  [phone]   Remember:  Do not eat food or drink liquids after midnight.  Take these medicines the morning of surgery with A SIP OF WATER : NEURONTIN, METHADONE, PERCOCET AND ZOLOFT   Do not wear jewelry, make-up or nail polish.  Do not wear lotions, powders, or perfumes.  You may wear deoderant.  Do not shave 48 hours prior to surgery.  Men may shave face and neck.  Do not bring valuables to the hospital.  [facility] is not responsible for any belongings or valuables.  Contacts, dentures or bridgework may not be worn into surgery.  Leave your suitcase in the car.  After surgery it may be brought to your room.  For patients admitted to the hospital, discharge time will be determined by your treatment team.  Patients discharged the day of surgery will not be allowed to drive home.   Name and phone number of your driver:   FAMILY Special instructions:  N/A  Please read over the following fact sheets that you were given. Care and Recovery After Surgery   General Anesthesia, Adult General anesthesia is a sleep-like state of non-feeling produced by medicines (anesthetics). General anesthesia prevents you from being alert and feeling pain during a medical procedure. Your caregiver may recommend general anesthesia if your procedure:  Is long.  Is painful or uncomfortable.  Would be frightening to see or hear.  Requires you to be still.  Affects your breathing.  Causes significant blood loss. LET YOUR CAREGIVER KNOW ABOUT:  Allergies to food or medicine.  Medicines taken, including vitamins, herbs, eyedrops, over-the-counter medicines, and creams.  Use of steroids (by mouth or creams).  Previous problems with anesthetics or numbing medicines, including problems experienced by  relatives.  History of bleeding problems or blood clots.  Previous surgeries and types of anesthetics received.  Possibility of pregnancy, if this applies.  Use of cigarettes, alcohol, or illegal drugs.  Any health condition(s), especially diabetes, sleep apnea, and high blood pressure. RISKS AND COMPLICATIONS General anesthesia rarely causes complications. However, if complications do occur, they can be life threatening. Complications include:  A lung infection.  A stroke.  A heart attack.  Waking up during the procedure. When this occurs, the patient may be unable to move and communicate that he or she is awake. The patient may feel severe pain. Older adults and adults with serious medical problems are more likely to have complications than adults who are young and healthy. Some complications can be prevented by answering all of your caregiver's questions thoroughly and by following all pre-procedure instructions. It is important to tell your caregiver if any of the pre-procedure instructions, especially those related to diet, were not followed. Any food or liquid in the stomach can cause problems when you are under general anesthesia. BEFORE THE PROCEDURE  Ask your caregiver if you will have to spend the night at the hospital. If you will not have to spend the night, arrange to have an adult drive you and stay with you for 24 hours.  Follow your caregiver's instructions if you are taking dietary supplements or medicines. Your caregiver may tell you to stop taking them or to reduce your dosage.  Do not smoke for as long as possible before your procedure. If possible,  stop smoking 3-6 weeks before the procedure.  Do not take new dietary supplements or medicines within 1 week of your procedure unless your caregiver approves them.  Do not eat within 8 hours of your procedure or as directed by your caregiver. Drink only clear liquids, such as water, black coffee (without milk or cream),  and fruit juices (without pulp).  Do not drink within 3 hours of your procedure or as directed by your caregiver.  You may brush your teeth on the morning of the procedure, but make sure to spit out the toothpaste and water when finished. PROCEDURE  You will receive anesthetics through a mask, through an intravenous (IV) access tube, or through both. A doctor who specializes in anesthesia (anesthesiologist) or a nurse who specializes in anesthesia (nurse anesthetist) or both will stay with you throughout the procedure to make sure you remain unconscious. He or she will also watch your blood pressure, pulse, and oxygen levels to make sure that the anesthetics do not cause any problems. Once you are asleep, a breathing tube or mask may be used to help you breathe. AFTER THE PROCEDURE You will wake up after the procedure is complete. You may be in the room where the procedure was performed or in a recovery area. You may have a sore throat if a breathing tube was used. You may also feel:  Dizzy.  Weak.  Drowsy.  Confused.  Nauseous.  Cold. These are all normal responses and can be expected to last for up to 24 hours after the procedure is complete. A caregiver will tell you when you are ready to go home. This will usually be when you are fully awake and in stable condition.   This information is not intended to replace advice given to you by your health care provider. Make sure you discuss any questions you have with your health care provider.   Document Released: 09/28/2007 Document Revised: 07/12/2014 Document Reviewed: 10/20/2011 Elsevier Interactive Patient Education 2016 [employer]. Salpingectomy, Care After Refer to this sheet in the next few weeks. These instructions provide you with information on caring for yourself after your procedure. Your health care provider may also give you more specific instructions. Your treatment has been planned according to current medical practices,  but problems sometimes occur. Call your health care provider if you have any problems or questions after your procedure. WHAT TO EXPECT AFTER THE PROCEDURE After your procedure, it is typical to have the following:  Abdominal pain that can be controlled with pain medicine.  Vaginal spotting.  Tiredness. HOME CARE INSTRUCTIONS  Get plenty of rest and sleep.  Only take over-the-counter or prescription medicines as directed by your health care provider.  Keep incision areas clean and dry. Remove or change any bandages (dressings) only as directed by your health care provider.  You may resume your regular diet. Eat a well-balanced diet.  Drink enough fluids to keep your urine clear or pale yellow.  Limit exercise and activities as directed by your health care provider. Do not lift anything heavier than 5 lb (2.3 kg) until your health care provider approves.  Do not drive until your health care provider approves.  Do not have sexual intercourse until your health care provider says it is okay.  Take your temperature twice a day for the first week. Write those temperatures down.  Follow up with your health care provider as directed. SEEK MEDICAL CARE IF:  You have pain when you urinate.  You see pus  coming out of the incision, or the incision is separating.  You have increasing abdominal pain.  You have swelling or redness in the incision area.  You develop a rash.  You feel lightheaded.  You have pain that is not controlled with medicine. SEEK IMMEDIATE MEDICAL CARE IF:  You develop a fever.  You have increasing abdominal pain.  You develop chest or leg pain.  You develop shortness of breath.  You pass out.   This information is not intended to replace advice given to you by your health care provider. Make sure you discuss any questions you have with your health care provider.   Document Released: 09/25/2010 Document Revised: 07/12/2014 Document Reviewed:  12/13/2012 Elsevier Interactive Patient Education [employer]

## 2016-02-09 ENCOUNTER — Encounter (HOSPITAL_COMMUNITY): Payer: Self-pay

## 2016-02-09 ENCOUNTER — Other Ambulatory Visit: Payer: Self-pay | Admitting: Obstetrics and Gynecology

## 2016-02-09 ENCOUNTER — Encounter (HOSPITAL_COMMUNITY)
Admission: RE | Admit: 2016-02-09 | Discharge: 2016-02-09 | Disposition: A | Discharge status: Home / Self Care | Payer: Medicaid Other | Source: Ambulatory Visit | Attending: Obstetrics and Gynecology | Admitting: Obstetrics and Gynecology

## 2016-02-09 DIAGNOSIS — Z01812 Encounter for preprocedural laboratory examination: Secondary | ICD-10-CM | POA: Diagnosis present

## 2016-02-09 LAB — URINE MICROSCOPIC-ADD ON
Bacteria, UA: NONE SEEN
WBC UA: NONE SEEN WBC/hpf (ref 0–5)

## 2016-02-09 LAB — BASIC METABOLIC PANEL
Anion gap: 4 — ABNORMAL LOW (ref 5–15)
BUN: 16 mg/dL (ref 6–20)
CO2: 25 mmol/L (ref 22–32)
CREATININE: 0.71 mg/dL (ref 0.44–1.00)
Calcium: 8.5 mg/dL — ABNORMAL LOW (ref 8.9–10.3)
Chloride: 106 mmol/L (ref 101–111)
GFR calc Af Amer: >60 mL/min (ref >60)
GLUCOSE: 92 mg/dL (ref 65–99)
POTASSIUM: 3.9 mmol/L (ref 3.5–5.1)
SODIUM: 135 mmol/L (ref 135–145)

## 2016-02-09 LAB — RAPID URINE DRUG SCREEN, HOSP PERFORMED
AMPHETAMINES: NOT DETECTED
Barbiturates: NOT DETECTED
Benzodiazepines: NOT DETECTED
Cocaine: NOT DETECTED
Opiates: NOT DETECTED
TETRAHYDROCANNABINOL: NOT DETECTED

## 2016-02-09 LAB — CBC
HCT: 39 % (ref 36.0–46.0)
Hemoglobin: 12.5 g/dL (ref 12.0–15.0)
MCH: 28.3 pg (ref 26.0–34.0)
MCHC: 32.1 g/dL (ref 30.0–36.0)
MCV: 88.2 fL (ref 78.0–100.0)
PLATELETS: 356 10*3/uL (ref 150–400)
RBC: 4.42 MIL/uL (ref 3.87–5.11)
RDW: 17.9 % — AB (ref 11.5–15.5)
WBC: 7.8 10*3/uL (ref 4.0–10.5)

## 2016-02-09 LAB — URINALYSIS, ROUTINE W REFLEX MICROSCOPIC
BILIRUBIN URINE: NEGATIVE
GLUCOSE, UA: NEGATIVE mg/dL
Ketones, ur: NEGATIVE mg/dL
Leukocytes, UA: NEGATIVE
Nitrite: NEGATIVE
PROTEIN: NEGATIVE mg/dL
Specific Gravity, Urine: <1.005 — ABNORMAL LOW (ref 1.005–1.030)
pH: 5.5 (ref 5.0–8.0)

## 2016-02-09 LAB — HCG, SERUM, QUALITATIVE: PREG SERUM: NEGATIVE

## 2016-02-09 NOTE — H&P (Signed)
H&P Date of Service: 02/13/2016 7:30 AM Alicia Burrow, MD  Obstetrics/Gynecology    Hide copied text  Progress Notes     atient ZO:XWRUEA Alicia Barnett, female DOB:12-Apr-1976, 40 y.o. VWU:[id number]  Preoperative History and Physical  Alicia Barnett is a 40 y.o. [id number] here for surgical management of permanent sterilization. No significant preoperative concerns. Pt is no longer breastfeeding.   Pt is currently taking Methadone, managed by [facility], as well as Zoloft and Gabapentin, managed by this office.   Pt states she ran out of Gabapentin 2 days ago and has been experiencing increased left arm pain and paresthesia from baseline. She reports that lying on her left side and sitting in certain positions worsens her pain. She states taking Percocet provides her 6 hours of relief. Per pt, she has been unable to pick up her refill because the pharmacy has been requesting authorization from the office.   Discussed with pt risks and benefits of BTL. Discussed using clips only vs bilateral salpingectomy. Advised pt that bilateral salpingectomy is a permanent procedure, but reduces the risk oftube/ovary cancer by 2/3, from 1/100 lifetime to 1/300 lifetime riskAt end of discussion, pt had opportunity to ask questions and has no further questions at this time.   Greater than 50% was spent in counseling and coordination of care with the patient. Total time greater than:   Proposed surgery: BTL, a.k.a bilateral salpingectomy      Past Medical History:  Diagnosis Date  . Anxiety   . Depression   . Polysubstance abuse         Past Surgical History:  Procedure Laterality Date  . LEEP    . NO PAST SURGERIES                     OB History  Gravida Para Term Preterm AB Living  SAB TAB Ectopic Multiple Live Births   2   3    # Outcome Date GA Lbr Len/2nd Weight Sex Delivery Anes PTL Lv  6 Gravida            5 Term 11/05/97 [redacted]w[redacted]d  7 lb 7 oz (3.374 kg) F Vag-Spont  Y LIV  4 Term 10/03/96 [redacted]w[redacted]d  8 lb 2 oz (3.685 kg) F Vag-Spont EPI N LIV  3 Term 02/02/94 [redacted]w[redacted]d  7 lb 13 oz (3.544 kg) F Vag-Spont  N LIV  2 TAB           1 TAB             Patient denies any other pertinent gynecologic issues.         Current Outpatient Prescriptions on File Prior to Visit  Medication Sig Dispense Refill  . gabapentin (NEURONTIN) 300 MG capsule Take 1-4 capsules (300-1,200 mg total) by mouth 3 (three) times daily. Take one tablet at lunch, one tablet at dinner, and 2 at bedtime. 120 capsule 3  . methadone (DOLOPHINE) 1 MG/1ML solution Take 115 mg by mouth daily.     . norethindrone (MICRONOR,CAMILA,ERRIN) 0.35 MG tablet Take 1 tablet (0.35 mg total) by mouth daily. 1 Package 2  . oxyCODONE-acetaminophen (PERCOCET/ROXICET) 5-325 MG tablet Take 1 tablet by mouth 2 (two) times daily. 28 tablet 0  . sertraline (ZOLOFT) 50 MG tablet Take 3 tablets (150 mg total) by mouth daily. 90 tablet 3   No current facility-administered medications on file prior to visit.        Allergies  Allergen Reactions  . Codeine Hives    Social History:reports that she has been smoking Cigarettes. She has a 11.50 pack-year smoking history. She has never used smokeless tobacco. She reports that she does not drink alcohol or use drugs.        Family History  Problem Relation Age of Onset  . Heart failure Mother   . Hypertension Mother   . Cancer Maternal Grandmother     lung  . Diabetes Maternal Grandfather   . Cancer Maternal Grandfather     liver  . Asthma Daughter   . Cancer Other     Review of Systems:Noncontributory  PHYSICAL EXAM: Blood pressure 120/80, height 5\' 3"  (1.6 m), weight 208 lb (94.3 kg), currently breastfeeding. General appearance - alert, well appearing, and in no distress Chest - clear to auscultation, no wheezes, rales or rhonchi,  symmetric air entry Heart - normal rate and regular rhythm Abdomen - soft, nontender, nondistended, no masses or organomegaly Pelvic exam:  VULVA: normal appearing vulva with no masses, tenderness or lesions,  VAGINA: normal appearing vagina with normal color and discharge, no lesions,  CERVIX: normal appearing cervix without discharge or lesions,  UTERUS: uterus is normal size, shape, consistency and nontender, appropriate size, first degree uterine descensus  ADNEXA: normal adnexa in size, nontender and no masses. Extremities - peripheral pulses normal, no pedal edema, no clubbing or cyanosis  Labs: No results found for this or any previous visit (from the past 336 hour(s)).  Imaging Studies: No results found.  Assessment:     Patient Active Problem List   Diagnosis Date Noted  . Postpartum care following vaginal delivery 01/21/2016  . Rubella non-immune status, antepartum 09/10/2015  . Supervision of other high-risk pregnancy 06/17/2015  . History of polysubstance abuse 06/17/2015  . Depression with anxiety 06/17/2015  . History of LEEP (loop electrosurgical excision procedure) of cervix complicating pregnancy 06/17/2015  . Smoker 06/17/2015    Plan: Patient will undergo surgical management with BTL on 02/13/16.  Will consult pharmacy for issues picking up Gabapentin : [facility] she stayed with the baby, as Medicaid will NOT refil the medicine early F/u for post op in 3 weeks Advised shoulder exercises to alleviate arm pain  Advised pt to pursue primary care with the new physician joining Dr. Anthony SarSimpson's office Will rx HCTZ and bid 5 mg Percocet x 28 tablets

## 2016-02-10 NOTE — Progress Notes (Signed)
atient ID: Alicia Barnett, female   DOB: Apr 26, 1976, 40 y.o.   MRN: [id number]  Preoperative History and Physical  Alicia Barnett is a 40 y.o. [id number] here for surgical management of permanent sterilization.   No significant preoperative concerns. Pt is no longer breastfeeding.   Pt is currently taking Methadone, managed by [facility], as well as Zoloft and Gabapentin, managed by this office.   Pt states she ran out of Gabapentin 2 days ago and has been experiencing increased left arm pain and paresthesia from baseline. She reports that lying on her left side and sitting in certain positions worsens her pain. She states taking Percocet provides her 6 hours of relief. Per pt, she has been unable to pick up her refill because the pharmacy has been requesting authorization from the office.   Discussed with pt risks and benefits of BTL. Discussed using clips only vs bilateral salpingectomy. Advised pt that bilateral salpingectomy is a permanent procedure, but reduces the risk of tube/ovary cancer by 2/3, from 1/100 lifetime to 1/300 lifetime risk At end of discussion, pt had opportunity to ask questions and has no further questions at this time.   Greater than 50% was spent in counseling and coordination of care with the patient. Total time greater than: 15 minutes    Proposed surgery: BTL, a.k.a  bilateral salpingectomy  Past Medical History:  Diagnosis Date  . Anxiety   . Depression   . Polysubstance abuse    Past Surgical History:  Procedure Laterality Date  . LEEP    . NO PAST SURGERIES     OB History  Gravida Para Term Preterm AB Living  SAB TAB Ectopic Multiple Live Births    2     3    # Outcome Date GA Lbr Len/2nd Weight Sex Delivery Anes PTL Lv  6 Gravida           5 Term 11/05/97 [redacted]w[redacted]d  7 lb 7 oz (3.374 kg) F Vag-Spont  Y LIV  4 Term 10/03/96 [redacted]w[redacted]d  8 lb 2 oz (3.685 kg) F Vag-Spont EPI N LIV  3 Term 02/02/94 [redacted]w[redacted]d  7 lb 13 oz (3.544 kg) F Vag-Spont  N LIV  2 TAB            1 TAB             Patient denies any other pertinent gynecologic issues.   Current Outpatient Prescriptions on File Prior to Visit  Medication Sig Dispense Refill  . gabapentin (NEURONTIN) 300 MG capsule Take 1-4 capsules (300-1,200 mg total) by mouth 3 (three) times daily. Take one tablet at lunch, one tablet at dinner, and 2 at bedtime. 120 capsule 3  . methadone (DOLOPHINE) 1 MG/1ML solution Take 115 mg by mouth daily.     . norethindrone (MICRONOR,CAMILA,ERRIN) 0.35 MG tablet Take 1 tablet (0.35 mg total) by mouth daily. 1 Package 2  . oxyCODONE-acetaminophen (PERCOCET/ROXICET) 5-325 MG tablet Take 1 tablet by mouth 2 (two) times daily. 28 tablet 0  . sertraline (ZOLOFT) 50 MG tablet Take 3 tablets (150 mg total) by mouth daily. 90 tablet 3   No current facility-administered medications on file prior to visit.    Allergies  Allergen Reactions  . Codeine Hives    Social History:   reports that she has been smoking Cigarettes.  She has a 11.50 pack-year smoking history. She has never used smokeless tobacco. She reports that she does not drink alcohol or  use drugs.  Family History  Problem Relation Age of Onset  . Heart failure Mother   . Hypertension Mother   . Cancer Maternal Grandmother     lung  . Diabetes Maternal Grandfather   . Cancer Maternal Grandfather     liver  . Asthma Daughter   . Cancer Other     Review of Systems: Noncontributory  PHYSICAL EXAM: Blood pressure 120/80, height 5\' 3"  (1.6 m), weight 208 lb (94.3 kg), currently breastfeeding. General appearance - alert, well appearing, and in no distress Chest - clear to auscultation, no wheezes, rales or rhonchi, symmetric air entry Heart - normal rate and regular rhythm Abdomen - soft, nontender, nondistended, no masses or organomegaly Pelvic exam:  VULVA: normal appearing vulva with no masses, tenderness or lesions,  VAGINA: normal appearing vagina with normal color and discharge, no lesions,  CERVIX:  normal appearing cervix without discharge or lesions,  UTERUS: uterus is normal size, shape, consistency and nontender, appropriate size, first degree uterine descensus  ADNEXA: normal adnexa in size, nontender and no masses.  Extremities - peripheral pulses normal, no pedal edema, no clubbing or cyanosis  Labs: No results found for this or any previous visit (from the past 336 hour(s)).  Imaging Studies: No results found.  Assessment: Patient Active Problem List   Diagnosis Date Noted  . Postpartum care following vaginal delivery 01/21/2016  . Rubella non-immune status, antepartum 09/10/2015  . Supervision of other high-risk pregnancy 06/17/2015  . History of polysubstance abuse 06/17/2015  . Depression with anxiety 06/17/2015  . History of LEEP (loop electrosurgical excision procedure) of cervix complicating pregnancy 06/17/2015  . Smoker 06/17/2015    Plan: Patient will undergo surgical management with BTL on 02/13/16.  Will consult pharmacy for issues picking up Gabapentin : [facility] where she stayed with the baby, as Medicaid will NOT refil the medicine early F/u for post op in 3 weeks Advised shoulder exercises to alleviate arm pain  Advised pt to pursue primary care with the new physician joining Dr. Anthony SarSimpson's office Will rx HCTZ and bid 5 mg Percocet x 28 tablets     .mec 02/04/2016 2:33 PM    By signing my name below, I, Doreatha MartinEva Mathews, attest that this documentation has been prepared under the direction and in the presence of Tilda BurrowJohn V Pelham Hennick, MD. Electronically Signed: Doreatha MartinEva Mathews, ED Scribe. 02/04/16. 3:18 PM.  I personally performed the services described in this documentation, which was SCRIBED in my presence. The recorded information has been reviewed and considered accurate. It has been edited as necessary during review. Tilda BurrowFERGUSON,Anarely Nicholls V, MD

## 2016-02-11 ENCOUNTER — Encounter: Payer: Self-pay | Admitting: Advanced Practice Midwife

## 2016-02-11 ENCOUNTER — Ambulatory Visit (INDEPENDENT_AMBULATORY_CARE_PROVIDER_SITE_OTHER): Payer: Medicaid Other | Admitting: Advanced Practice Midwife

## 2016-02-11 NOTE — Progress Notes (Signed)
Alicia Barnett is a 40 y.o. who presents for a postpartum visit. She is 6 weeks postpartum following a spontaneous vaginal delivery. I have fully reviewed the prenatal and intrapartum course. The delivery was at 39 gestational weeks. Prenatal and intrapartum course notable for pulmonary HTN,  Late pregnancy was notable for pulmonary hypertension after a severe respiratory illness that resulted in [facility] care there and delivered after induction at 39 weeks  Anesthesia: epidural. Postpartum course has been uncomplicated. Baby's course has been uneventful. Baby is feeding by bottle. Bleeding: started POPs 7/19, still bleeding.  Refuses pelvic exam, states had one Friday and everything was fine. Bowel function is normal. Bladder function is normal. Patient is sexually active. Contraception method is condoms. Postpartum depression screening: negative.     She came for a preop visit on 8/2.  Plans salpingectomy 8/11.  Was given Rx for percocet #20 for back pain.  Has been on Methadone throughout pregnancy.  Discussed that she is going down a slippery slope asking for narcotics when she is an addict.  Started micronoir 7/19, lots of BTB>    Current Outpatient Prescriptions:  .  gabapentin (NEURONTIN) 300 MG capsule, Take 1-4 capsules (300-1,200 mg total) by mouth 3 (three) times daily. Take one tablet at lunch, one tablet at dinner, and 2 at bedtime., Disp: 120 capsule, Rfl: 3 .  hydrochlorothiazide (HYDRODIURIL) 25 MG tablet, Take 1 tablet (25 mg total) by mouth daily., Disp: 30 tablet, Rfl: 3 .  methadone (DOLOPHINE) 1 MG/1ML solution, Take 115 mg by mouth daily. , Disp: , Rfl:  .  norethindrone (MICRONOR,CAMILA,ERRIN) 0.35 MG tablet, Take 1 tablet (0.35 mg total) by mouth daily., Disp: 1 Package, Rfl: 2 .  oxyCODONE-acetaminophen (PERCOCET/ROXICET) 5-325 MG tablet, Take 1 tablet by mouth 2 (two) times daily., Disp: 28 tablet, Rfl: 0 .  sertraline (ZOLOFT) 50 MG  tablet, Take 3 tablets (150 mg total) by mouth daily., Disp: 90 tablet, Rfl: 3  Review of Systems   Constitutional: Negative for fever and chills Eyes: Negative for visual disturbances Respiratory: Negative for shortness of breath, dyspnea Cardiovascular: Negative for chest pain or palpitations  Gastrointestinal: Negative for vomiting, diarrhea and constipation Genitourinary: Negative for dysuria and urgency Musculoskeletal: Negative for back pain, joint pain, myalgias  Neurological: Negative for dizziness and headaches   Objective:     Vitals:   02/11/16 0958  BP: 112/74  Pulse: 64   General:  alert, cooperative and no distress   Breasts:  negative  Lungs: clear to auscultation bilaterally  Heart:  regular rate and rhythm  Abdomen: Soft, nontender   Vulva:   Vagina:   Cervix:    Corpus:      Rectal Exam:         Assessment:    normal postpartum exam.  Plan:    1. Contraception: tubal ligation , scheduled 8/11 2.  Recommend not continuing to provide narcotics, BTL post op management notwithstanding

## 2016-02-13 ENCOUNTER — Ambulatory Visit (HOSPITAL_COMMUNITY)
Admission: RE | Admit: 2016-02-13 | Discharge: 2016-02-13 | Disposition: A | Discharge status: Home / Self Care | Payer: Medicaid Other | Source: Ambulatory Visit | Attending: Obstetrics and Gynecology | Admitting: Obstetrics and Gynecology

## 2016-02-13 ENCOUNTER — Ambulatory Visit (HOSPITAL_COMMUNITY): Payer: Medicaid Other | Admitting: Anesthesiology

## 2016-02-13 ENCOUNTER — Encounter (HOSPITAL_COMMUNITY): Payer: Self-pay | Admitting: Anesthesiology

## 2016-02-13 ENCOUNTER — Encounter (HOSPITAL_COMMUNITY)
Admission: RE | Disposition: A | Discharge status: Home / Self Care | Payer: Self-pay | Source: Ambulatory Visit | Attending: Obstetrics and Gynecology

## 2016-02-13 DIAGNOSIS — Z302 Encounter for sterilization: Secondary | ICD-10-CM | POA: Diagnosis present

## 2016-02-13 DIAGNOSIS — F418 Other specified anxiety disorders: Secondary | ICD-10-CM | POA: Diagnosis not present

## 2016-02-13 DIAGNOSIS — Z79899 Other long term (current) drug therapy: Secondary | ICD-10-CM | POA: Diagnosis not present

## 2016-02-13 DIAGNOSIS — F17210 Nicotine dependence, cigarettes, uncomplicated: Secondary | ICD-10-CM | POA: Diagnosis not present

## 2016-02-13 HISTORY — PX: LAPAROSCOPIC BILATERAL SALPINGECTOMY: SHX5889

## 2016-02-13 SURGERY — SALPINGECTOMY, BILATERAL, LAPAROSCOPIC
Anesthesia: General | Laterality: Bilateral

## 2016-02-13 MED ORDER — KETOROLAC TROMETHAMINE 10 MG PO TABS: 10.0000 mg | ORAL_TABLET | Freq: Four times a day (QID) | ORAL | 0 refills | Status: DC | PRN

## 2016-02-13 MED ORDER — ONDANSETRON HCL 4 MG/2ML IJ SOLN
4.0000 mg | Freq: Once | INTRAMUSCULAR | Status: AC
  Administered 2016-02-13: 4 mg via INTRAVENOUS

## 2016-02-13 MED ORDER — MIDAZOLAM HCL 2 MG/2ML IJ SOLN
1.0000 mg | INTRAMUSCULAR | Status: DC | PRN
  Administered 2016-02-13: 2 mg via INTRAVENOUS

## 2016-02-13 MED ORDER — SUCCINYLCHOLINE 20MG/ML (10ML) SYRINGE FOR MEDFUSION PUMP - OPTIME
INTRAMUSCULAR | Status: DC | PRN
  Administered 2016-02-13: 120 mg via INTRAVENOUS

## 2016-02-13 MED ORDER — PROPOFOL 10 MG/ML IV BOLUS
INTRAVENOUS | Status: DC | PRN
  Administered 2016-02-13: 150 mg via INTRAVENOUS
  Administered 2016-02-13: 30 mg via INTRAVENOUS

## 2016-02-13 MED ORDER — ONDANSETRON HCL 4 MG/2ML IJ SOLN
INTRAMUSCULAR | Status: AC
  Filled 2016-02-13: qty 2

## 2016-02-13 MED ORDER — EPHEDRINE SULFATE 50 MG/ML IJ SOLN
INTRAMUSCULAR | Status: AC
  Filled 2016-02-13: qty 1

## 2016-02-13 MED ORDER — LIDOCAINE HCL (PF) 1 % IJ SOLN
INTRAMUSCULAR | Status: AC
  Filled 2016-02-13: qty 5

## 2016-02-13 MED ORDER — ROCURONIUM BROMIDE 50 MG/5ML IV SOLN
INTRAVENOUS | Status: AC
Start: 2016-02-13 — End: 2016-02-13
  Filled 2016-02-13: qty 1

## 2016-02-13 MED ORDER — SODIUM CHLORIDE 0.9 % IJ SOLN
INTRAMUSCULAR | Status: AC
  Filled 2016-02-13: qty 10

## 2016-02-13 MED ORDER — LACTATED RINGERS IV SOLN
INTRAVENOUS | Status: DC
  Administered 2016-02-13 (×2): via INTRAVENOUS

## 2016-02-13 MED ORDER — HYDROMORPHONE HCL 1 MG/ML IJ SOLN
0.2500 mg | INTRAMUSCULAR | Status: DC | PRN
  Administered 2016-02-13: 0.5 mg via INTRAVENOUS
  Filled 2016-02-13: qty 1

## 2016-02-13 MED ORDER — MIDAZOLAM HCL 2 MG/2ML IJ SOLN
INTRAMUSCULAR | Status: AC
  Filled 2016-02-13: qty 2

## 2016-02-13 MED ORDER — SUCCINYLCHOLINE CHLORIDE 20 MG/ML IJ SOLN
INTRAMUSCULAR | Status: AC
  Filled 2016-02-13: qty 1

## 2016-02-13 MED ORDER — GLYCOPYRROLATE 0.2 MG/ML IJ SOLN
INTRAMUSCULAR | Status: AC
  Filled 2016-02-13: qty 7

## 2016-02-13 MED ORDER — FENTANYL CITRATE (PF) 100 MCG/2ML IJ SOLN
INTRAMUSCULAR | Status: DC | PRN
Start: 2016-02-13 — End: 2016-02-13
  Administered 2016-02-13 (×5): 50 ug via INTRAVENOUS

## 2016-02-13 MED ORDER — NEOSTIGMINE METHYLSULFATE 10 MG/10ML IV SOLN
INTRAVENOUS | Status: AC
  Filled 2016-02-13: qty 2

## 2016-02-13 MED ORDER — GLYCOPYRROLATE 0.2 MG/ML IJ SOLN
INTRAMUSCULAR | Status: DC | PRN
  Administered 2016-02-13: .6 mg via INTRAVENOUS

## 2016-02-13 MED ORDER — ROCURONIUM 10MG/ML (10ML) SYRINGE FOR MEDFUSION PUMP - OPTIME
INTRAVENOUS | Status: DC | PRN
  Administered 2016-02-13: 8 mg via INTRAVENOUS
  Administered 2016-02-13: 22 mg via INTRAVENOUS

## 2016-02-13 MED ORDER — NEOSTIGMINE METHYLSULFATE 10 MG/10ML IV SOLN
INTRAVENOUS | Status: DC | PRN
  Administered 2016-02-13: 4 mg via INTRAVENOUS

## 2016-02-13 MED ORDER — 0.9 % SODIUM CHLORIDE (POUR BTL) OPTIME
TOPICAL | Status: DC | PRN
Start: 2016-02-13 — End: 2016-02-13
  Administered 2016-02-13: 1000 mL

## 2016-02-13 MED ORDER — LIDOCAINE HCL (CARDIAC) 10 MG/ML IV SOLN
INTRAVENOUS | Status: DC | PRN
  Administered 2016-02-13: 40 mg via INTRAVENOUS

## 2016-02-13 MED ORDER — PROPOFOL 10 MG/ML IV BOLUS
INTRAVENOUS | Status: AC
  Filled 2016-02-13: qty 20

## 2016-02-13 MED ORDER — FENTANYL CITRATE (PF) 250 MCG/5ML IJ SOLN
INTRAMUSCULAR | Status: AC
  Filled 2016-02-13: qty 5

## 2016-02-13 SURGICAL SUPPLY — 38 items
BAG HAMPER (MISCELLANEOUS) ×3 IMPLANT
BANDAGE STRIP 1X3 FLEXIBLE (GAUZE/BANDAGES/DRESSINGS) ×9 IMPLANT
BLADE SURG SZ11 CARB STEEL (BLADE) ×3 IMPLANT
CATH ROBINSON RED A/P 16FR (CATHETERS) ×3 IMPLANT
CLOSURE STERI-STRIP 1/4X4 (GAUZE/BANDAGES/DRESSINGS) ×3 IMPLANT
CLOSURE WOUND 1/4 X3 (GAUZE/BANDAGES/DRESSINGS) ×1
CLOTH BEACON ORANGE TIMEOUT ST (SAFETY) ×3 IMPLANT
COVER LIGHT HANDLE STERIS (MISCELLANEOUS) ×6 IMPLANT
DECANTER SPIKE VIAL GLASS SM (MISCELLANEOUS) ×3 IMPLANT
DURAPREP 26ML APPLICATOR (WOUND CARE) ×3 IMPLANT
ELECT REM PT RETURN 9FT ADLT (ELECTROSURGICAL) ×3
ELECTRODE REM PT RTRN 9FT ADLT (ELECTROSURGICAL) ×1 IMPLANT
FORMALIN 10 PREFIL 120ML (MISCELLANEOUS) ×3 IMPLANT
GLOVE BIOGEL PI IND STRL 7.0 (GLOVE) ×4 IMPLANT
GLOVE BIOGEL PI IND STRL 9 (GLOVE) ×1 IMPLANT
GLOVE BIOGEL PI INDICATOR 7.0 (GLOVE) ×8
GLOVE BIOGEL PI INDICATOR 9 (GLOVE) ×2
GLOVE ECLIPSE 6.5 STRL STRAW (GLOVE) ×3 IMPLANT
GLOVE ECLIPSE 9.0 STRL (GLOVE) ×6 IMPLANT
GOWN SPEC L3 XXLG W/TWL (GOWN DISPOSABLE) ×3 IMPLANT
GOWN STRL REUS W/TWL LRG LVL3 (GOWN DISPOSABLE) ×3 IMPLANT
INST SET LAPROSCOPIC GYN AP (KITS) ×3 IMPLANT
KIT ROOM TURNOVER APOR (KITS) ×3 IMPLANT
NEEDLE INSUFFLATION 120MM (ENDOMECHANICALS) ×3 IMPLANT
NS IRRIG 1000ML POUR BTL (IV SOLUTION) ×3 IMPLANT
PACK PERI GYN (CUSTOM PROCEDURE TRAY) ×3 IMPLANT
PAD ARMBOARD 7.5X6 YLW CONV (MISCELLANEOUS) ×3 IMPLANT
SET BASIN LINEN APH (SET/KITS/TRAYS/PACK) ×3 IMPLANT
SHEARS HARMONIC ACE PLUS 36CM (ENDOMECHANICALS) ×3 IMPLANT
SLEEVE ENDOPATH XCEL 5M (ENDOMECHANICALS) ×6 IMPLANT
SOLUTION ANTI FOG 6CC (MISCELLANEOUS) ×3 IMPLANT
STRIP CLOSURE SKIN 1/4X3 (GAUZE/BANDAGES/DRESSINGS) ×2 IMPLANT
SUT VIC AB 4-0 PS2 27 (SUTURE) ×3 IMPLANT
SYR 30ML LL (SYRINGE) ×3 IMPLANT
SYRINGE 10CC LL (SYRINGE) ×3 IMPLANT
TROCAR XCEL NON-BLD 5MMX100MML (ENDOMECHANICALS) ×3 IMPLANT
TUBING INSUFFLATION (TUBING) ×3 IMPLANT
WARMER LAPAROSCOPE (MISCELLANEOUS) ×3 IMPLANT

## 2016-02-13 NOTE — Anesthesia Postprocedure Evaluation (Signed)
Anesthesia Post Note  Patient: Alicia Barnett  Procedure(s) Performed: Procedure(s) (LRB): LAPAROSCOPIC BILATERAL SALPINGECTOMY (Bilateral)  Patient location during evaluation: PACU Anesthesia Type: General Level of consciousness: awake and alert and oriented Pain management: pain level controlled Vital Signs Assessment: post-procedure vital signs reviewed and stable Respiratory status: spontaneous breathing Cardiovascular status: blood pressure returned to baseline Postop Assessment: no signs of nausea or vomiting Anesthetic complications: no    Last Vitals:  Vitals:   02/13/16 0930 02/13/16 0944  BP:  105/79  Pulse: 62 67  Resp: 14 14  Temp:  36.5 C    Last Pain:  Vitals:   02/13/16 0944  TempSrc: Oral  PainSc:                  Glynn OctaveANIEL,Kera Deacon

## 2016-02-13 NOTE — H&P (Signed)
Progress Notes     atient ID: Alicia CorneaNicole Wiberg, female   DOB: 1975/11/02, 40 y.o.   MRN: [id number]  Preoperative History and Physical  Alicia Corneaicole Damaso is a 40 y.o. [id number] here for surgical management of permanent sterilization.   No significant preoperative concerns. Pt is no longer breastfeeding.   Pt is currently taking Methadone, managed by [facility], as well as Zoloft and Gabapentin, managed by this office.   Pt states she ran out of Gabapentin 2 days ago and has been experiencing increased left arm pain and paresthesia from baseline. She reports that lying on her left side and sitting in certain positions worsens her pain. She states taking Percocet provides her 6 hours of relief. Per pt, she has been unable to pick up her refill because the pharmacy has been requesting authorization from the office.   Discussed with pt risks and benefits of BTL. Discussed using clips only vs bilateral salpingectomy. Advised pt that bilateral salpingectomy is a permanent procedure, but reduces the risk of tube/ovary cancer by 2/3, from 1/100 lifetime to 1/300 lifetime risk At end of discussion, pt had opportunity to ask questions and has no further questions at this time.   Greater than 50% was spent in counseling and coordination of care with the patient. Total time greater than: 15 minutes    Proposed surgery: BTL, a.k.a  bilateral salpingectomy      Past Medical History:  Diagnosis Date  . Anxiety   . Depression   . Polysubstance abuse         Past Surgical History:  Procedure Laterality Date  . LEEP    . NO PAST SURGERIES                     OB History  Gravida Para Term Preterm AB Living  6 3 3   2 3   SAB TAB Ectopic Multiple Live Births    2     3    # Outcome Date GA Lbr Len/2nd Weight Sex Delivery Anes PTL Lv  6 Gravida           5 Term 11/05/97 4935w0d  7 lb 7 oz (3.374 kg) F Vag-Spont  Y LIV  4 Term 10/03/96 5426w0d  8 lb 2 oz (3.685 kg) F Vag-Spont EPI N  LIV  3 Term 02/02/94 3728w0d  7 lb 13 oz (3.544 kg) F Vag-Spont  N LIV  2 TAB           1 TAB             Patient denies any other pertinent gynecologic issues.         Current Outpatient Prescriptions on File Prior to Visit  Medication Sig Dispense Refill  . gabapentin (NEURONTIN) 300 MG capsule Take 1-4 capsules (300-1,200 mg total) by mouth 3 (three) times daily. Take one tablet at lunch, one tablet at dinner, and 2 at bedtime. 120 capsule 3  . methadone (DOLOPHINE) 1 MG/1ML solution Take 115 mg by mouth daily.     . norethindrone (MICRONOR,CAMILA,ERRIN) 0.35 MG tablet Take 1 tablet (0.35 mg total) by mouth daily. 1 Package 2  . oxyCODONE-acetaminophen (PERCOCET/ROXICET) 5-325 MG tablet Take 1 tablet by mouth 2 (two) times daily. 28 tablet 0  . sertraline (ZOLOFT) 50 MG tablet Take 3 tablets (150 mg total) by mouth daily. 90 tablet 3   No current facility-administered medications on file prior to visit.        Allergies  Allergen Reactions  .  Codeine Hives    Social History:   reports that she has been smoking Cigarettes.  She has a 11.50 pack-year smoking history. She has never used smokeless tobacco. She reports that she does not drink alcohol or use drugs.        Family History  Problem Relation Age of Onset  . Heart failure Mother   . Hypertension Mother   . Cancer Maternal Grandmother     lung  . Diabetes Maternal Grandfather   . Cancer Maternal Grandfather     liver  . Asthma Daughter   . Cancer Other     Review of Systems: Noncontributory  PHYSICAL EXAM: Blood pressure 120/80, height  (1.6 m), weight 208 lb (94.3 kg), currently breastfeeding. General appearance - alert, well appearing, and in no distress Chest - clear to auscultation, no wheezes, rales or rhonchi, symmetric air entry Heart - normal rate and regular rhythm Abdomen - soft, nontender, nondistended, no masses or organomegaly Pelvic exam:  VULVA:  normal appearing vulva with no masses, tenderness or lesions,  VAGINA: normal appearing vagina with normal color and discharge, no lesions,  CERVIX: normal appearing cervix without discharge or lesions,  UTERUS: uterus is normal size, shape, consistency and nontender, appropriate size, first degree uterine descensus  ADNEXA: normal adnexa in size, nontender and no masses.  Extremities - peripheral pulses normal, no pedal edema, no clubbing or cyanosis  Labs: No results found for this or any previous visit (from the past 336 hour(s)).  Imaging Studies: No results found.  Assessment:     Patient Active Problem List   Diagnosis Date Noted  . Postpartum care following vaginal delivery 01/21/2016  . Rubella non-immune status, antepartum 09/10/2015  . Supervision of other high-risk pregnancy 06/17/2015  . History of polysubstance abuse 06/17/2015  . Depression with anxiety 06/17/2015  . History of LEEP (loop electrosurgical excision procedure) of cervix complicating pregnancy 06/17/2015  . Smoker 06/17/2015    Plan: Patient will undergo surgical management with BTL on 02/13/16.  Will consult pharmacy for issues picking up Gabapentin : pt will need to go back [facility] to pick up meds from where she stayed with the baby, as Medicaid will NOT refil the medicine early F/u for post op in 3 weeks Advised shoulder exercises to alleviate arm pain  Advised pt to pursue primary care with the new physician joining Dr. Anthony Sar office Will rx HCTZ and bid 5 mg Percocet x 28 tablets

## 2016-02-13 NOTE — Transfer of Care (Signed)
Immediate Anesthesia Transfer of Care Note  Patient: Alicia Barnett  Procedure(s) Performed: Procedure(s): LAPAROSCOPIC BILATERAL SALPINGECTOMY (Bilateral)  Patient Location: PACU  Anesthesia Type:General  Level of Consciousness: awake, alert  and oriented  Airway & Oxygen Therapy: Patient Spontanous Breathing and Patient connected to face mask oxygen  Post-op Assessment: Report given to RN  Post vital signs: Reviewed  Last Vitals:  Vitals:   02/13/16 0715 02/13/16 0730  BP: 101/62 107/69  Resp: 12 18  Temp:      Last Pain:  Vitals:   02/13/16 0623  TempSrc: Oral      Patients Stated Pain Goal: 9 (02/13/16 [id number])  Complications: No apparent anesthesia complications

## 2016-02-13 NOTE — Op Note (Signed)
02/13/2016  8:39 AM  PATIENT:  Alicia Barnett  40 y.o. female  PRE-OPERATIVE DIAGNOSIS:  request permanent sterilization   POST-OPERATIVE DIAGNOSIS:  request permanent sterilization   PROCEDURE:  Procedure(s): LAPAROSCOPIC BILATERAL SALPINGECTOMY (Bilateral)  SURGEON:  Surgeon(s) and Role:    * Syair Fricker V Myiah Petkus, MD - Primary  PHYSICIAN ASSISTANT:   ASSISTANTS: none   ANESTHESIA:   general  EBL:  Total I/O In: 800 [I.V.:800] Out: 310 [Urine:300; Blood:10]  BLOOD ADMINISTERED:none  DRAINS: none   LOCAL MEDICATIONS USED:  NONE  SPECIMEN:  Source of Specimen:  Bilateral fallopian tubes  DISPOSITION OF SPECIMEN:  PATHOLOGY  COUNTS:  YES  TOURNIQUET:  * No tourniquets in log *  DICTATION: .Dragon Dictation  PLAN OF CARE: Discharge to home after PACU  PATIENT DISPOSITION:  PACU - hemodynamically stable.   Delay start of Pharmacological VTE agent (>24hrs) due to surgical blood loss or risk of bleeding: not applicable Indications:  40-year-old female, recently postpartum desiring permanent sterilization with counseling and consents obtained. Details of procedure: Patient was taken operating room prepped and draped for lower abdominal and vaginal procedure exam low lithotomy leg support, yellow fins, with perineum prepped and draped in and out catheterization of bladder draining 400 cc of clear urine, and abdomen prepped with DuraPrep. Timeout was conducted by surgical team and procedure confirmed. Tenaculum was used to grasp the anterior lip of the cervix and Hulka tenaculum attached to the uterus for manipulation of the uterine body. An infraumbilical vertical 1 cm skin incision was made as well as a transverse suprapubic and right lower quadrant incisions of similar length. Needle was used through the umbilical port orienting the needle toward the pelvis and elevating the abdominal wall with her droplet technique confirmed used to confirm intraperitoneal location. 3 L CO2 was  instilled at 7 mm intra-abdominal pressure, and 5 mm laparoscopic trocar introduced through the umbilicus without difficulty. Suprapubic and right lower quadrant trochars were inserted under direct visualization with laparoscope in place. Photo was taken of the pelvis. There was no evidence of bleeding or trauma associated with peritoneal entry. Attention was then directed to the left fallopian tube which was identified elevated to its fimbriated end and then salpingectomy performed using harmonic scalpel beginning at the cornu of the uterus and oozing the Harmonic Ace 7 relation setting the salpingectomy was performed and the tube extracted the right tube was treated similarly and extracted. Simmons were sent for pathology. Pedicles were inspected and confirmed as hemostatic. 120 cc of saline was instilled in the abdomen to assist with evacuation of the pneumoperitoneum the abdomen decompressed, laparoscopic trochars removed and subcuticular 4-0 Vicryl closure of all 3 skin sites performed Steri-Strips and Band-Aids were placed. The tenaculum was removed. Patient to recovery room in stable condition with sponge and needle counts correct. 

## 2016-02-13 NOTE — Anesthesia Procedure Notes (Signed)
Procedure Name: Intubation Date/Time: 02/13/2016 7:46 AM Performed by: Glynn OctaveANIEL, Aevah Stansbery E Pre-anesthesia Checklist: Patient identified, Patient being monitored, Timeout performed, Emergency Drugs available and Suction available Patient Re-evaluated:Patient Re-evaluated prior to inductionOxygen Delivery Method: Circle system utilized Preoxygenation: Pre-oxygenation with 100% oxygen Intubation Type: IV induction, Rapid sequence and Cricoid Pressure applied Ventilation: Mask ventilation without difficulty Laryngoscope Size: Mac and 3 Grade View: Grade I Tube type: Oral Tube size: 7.0 mm Number of attempts: 1 Airway Equipment and Method: Stylet Placement Confirmation: ETT inserted through vocal cords under direct vision,  positive ETCO2 and breath sounds checked- equal and bilateral Secured at: 21 cm Tube secured with: Tape Dental Injury: Teeth and Oropharynx as per pre-operative assessment

## 2016-02-13 NOTE — Anesthesia Preprocedure Evaluation (Signed)
Anesthesia Evaluation  Patient identified by MRN, date of birth, ID band Patient awake    Reviewed: Allergy & Precautions, NPO status , Patient's Chart, lab work & pertinent test results  Airway Mallampati: II  TM Distance: >3 FB     Dental  (+) Teeth Intact   Pulmonary Current Smoker,    breath sounds clear to auscultation       Cardiovascular negative cardio ROS   Rhythm:Regular Rate:Normal     Neuro/Psych PSYCHIATRIC DISORDERS Anxiety Depression    GI/Hepatic negative GI ROS,   Endo/Other    Renal/GU      Musculoskeletal   Abdominal   Peds  Hematology   Anesthesia Other Findings   Reproductive/Obstetrics                             Anesthesia Physical Anesthesia Plan  ASA: III  Anesthesia Plan: General   Post-op Pain Management:    Induction: Intravenous  Airway Management Planned: Oral ETT  Additional Equipment:   Intra-op Plan:   Post-operative Plan: Extubation in OR  Informed Consent: I have reviewed the patients History and Physical, chart, labs and discussed the procedure including the risks, benefits and alternatives for the proposed anesthesia with the patient or authorized representative who has indicated his/her understanding and acceptance.     Plan Discussed with:   Anesthesia Plan Comments:         Anesthesia Quick Evaluation

## 2016-02-13 NOTE — Progress Notes (Signed)
Delores, mother-in-law, notified via telephone to remind pt to call Dr. Emelda FearFerguson if she continues to have bleeding.

## 2016-02-13 NOTE — Progress Notes (Signed)
Pt having some vaginal bleeding. Pad and mesh panties given to pt. Pt instructed to call Dr. Rayna SextonFerguson's office if this continues. Pt verbalizes understanding.

## 2016-02-13 NOTE — Brief Op Note (Signed)
02/13/2016  8:39 AM  PATIENT:  Alicia Barnett  40 y.o. female  PRE-OPERATIVE DIAGNOSIS:  request permanent sterilization   POST-OPERATIVE DIAGNOSIS:  request permanent sterilization   PROCEDURE:  Procedure(s): LAPAROSCOPIC BILATERAL SALPINGECTOMY (Bilateral)  SURGEON:  Surgeon(s) and Role:    * Tilda BurrowJohn V Juvenal Umar, MD - Primary  PHYSICIAN ASSISTANT:   ASSISTANTS: none   ANESTHESIA:   general  EBL:  Total I/O In: 800 [I.V.:800] Out: 310 [Urine:300; Blood:10]  BLOOD ADMINISTERED:none  DRAINS: none   LOCAL MEDICATIONS USED:  NONE  SPECIMEN:  Source of Specimen:  Bilateral fallopian tubes  DISPOSITION OF SPECIMEN:  PATHOLOGY  COUNTS:  YES  TOURNIQUET:  * No tourniquets in log *  DICTATION: .Dragon Dictation  PLAN OF CARE: Discharge to home after PACU  PATIENT DISPOSITION:  PACU - hemodynamically stable.   Delay start of Pharmacological VTE agent (>24hrs) due to surgical blood loss or risk of bleeding: not applicable Indications:  40 year old female, recently postpartum desiring permanent sterilization with counseling and consents obtained. Details of procedure: Patient was taken operating room prepped and draped for lower abdominal and vaginal procedure exam low lithotomy leg support, yellow fins, with perineum prepped and draped in and out catheterization of bladder draining 400 cc of clear urine, and abdomen prepped with DuraPrep. Timeout was conducted by surgical team and procedure confirmed. Tenaculum was used to grasp the anterior lip of the cervix and Hulka tenaculum attached to the uterus for manipulation of the uterine body. An infraumbilical vertical 1 cm skin incision was made as well as a transverse suprapubic and right lower quadrant incisions of similar length. Needle was used through the umbilical port orienting the needle toward the pelvis and elevating the abdominal wall with her droplet technique confirmed used to confirm intraperitoneal location. 3 L CO2 was  instilled at 7 mm intra-abdominal pressure, and 5 mm laparoscopic trocar introduced through the umbilicus without difficulty. Suprapubic and right lower quadrant trochars were inserted under direct visualization with laparoscope in place. Photo was taken of the pelvis. There was no evidence of bleeding or trauma associated with peritoneal entry. Attention was then directed to the left fallopian tube which was identified elevated to its fimbriated end and then salpingectomy performed using harmonic scalpel beginning at the cornu of the uterus and oozing the Harmonic Ace 7 relation setting the salpingectomy was performed and the tube extracted the right tube was treated similarly and extracted. Simmons were sent for pathology. Pedicles were inspected and confirmed as hemostatic. 120 cc of saline was instilled in the abdomen to assist with evacuation of the pneumoperitoneum the abdomen decompressed, laparoscopic trochars removed and subcuticular 4-0 Vicryl closure of all 3 skin sites performed Steri-Strips and Band-Aids were placed. The tenaculum was removed. Patient to recovery room in stable condition with sponge and needle counts correct.

## 2016-02-13 NOTE — Discharge Instructions (Signed)
Salpingectomy, Care After  Refer to this sheet in the next few weeks. These instructions provide you with information on caring for yourself after your procedure. Your health care provider may also give you more specific instructions. Your treatment has been planned according to current medical practices, but problems sometimes occur. Call your health care provider if you have any problems or questions after your procedure.  WHAT TO EXPECT AFTER THE PROCEDURE  After your procedure, it is typical to have the following:  · Abdominal pain that can be controlled with pain medicine.  · Vaginal spotting.  · Tiredness.  HOME CARE INSTRUCTIONS  · Get plenty of rest and sleep.  · Only take over-the-counter or prescription medicines as directed by your health care provider.  · Keep incision areas clean and dry. Remove or change any bandages (dressings) only as directed by your health care provider.  · You may resume your regular diet. Eat a well-balanced diet.  · Drink enough fluids to keep your urine clear or pale yellow.  · Limit exercise and activities as directed by your health care provider. Do not lift anything heavier than 5 lb (2.3 kg) until your health care provider approves.  · Do not drive until your health care provider approves.  · Do not have sexual intercourse until your health care provider says it is okay.  · Take your temperature twice a day for the first week. Write those temperatures down.  · Follow up with your health care provider as directed.  SEEK MEDICAL CARE IF:  · You have pain when you urinate.  · You see pus coming out of the incision, or the incision is separating.  · You have increasing abdominal pain.  · You have swelling or redness in the incision area.  · You develop a rash.  · You feel lightheaded.  · You have pain that is not controlled with medicine.  SEEK IMMEDIATE MEDICAL CARE IF:  · You develop a fever.  · You have increasing abdominal pain.  · You develop chest or leg pain.  · You  develop shortness of breath.  · You pass out.     This information is not intended to replace advice given to you by your health care provider. Make sure you discuss any questions you have with your health care provider.     Document Released: 09/25/2010 Document Revised: 07/12/2014 Document Reviewed: 12/13/2012  Elsevier Interactive Patient Education ©2016 [employer]

## 2016-02-16 ENCOUNTER — Ambulatory Visit (INDEPENDENT_AMBULATORY_CARE_PROVIDER_SITE_OTHER): Payer: Medicaid Other | Admitting: Obstetrics and Gynecology

## 2016-02-16 VITALS — BP 110/70 | Ht 63.5 in | Wt 216.0 lb

## 2016-02-16 DIAGNOSIS — K429 Umbilical hernia without obstruction or gangrene: Secondary | ICD-10-CM

## 2016-02-16 MED ORDER — OXYCODONE-ACETAMINOPHEN 5-325 MG PO TABS: 1.0000 | ORAL_TABLET | ORAL | 0 refills | Status: DC | PRN

## 2016-02-16 NOTE — Progress Notes (Addendum)
  Subjective: Work in appointment   Alicia Barnett is a 40 y.o. female now 3 days status post laparoscopic bilateral salpingectomy. She reports a painful lump to the umbilicus. She is managed for methadone by [facility]. [place]. She had a pre-existing methadone addiction, which was managed throughout the pregnancy with [facility] help. The patient has multiple complaints and tolerates the inconvenience of healthcare issues poorly. Patient complains of persistent, intermittent, unchanged LUE paresthesias that have been chronic for her. She has tried hot and cold compresses along with ace wrapping without significant relief.   Review of Systems Negative except pain to umbilicus    Diet:   normal   Bowel movements : normal. Pain controlled with Toradol and oxycodone  The patient had a small number of oxycodone leftover from postpartum care which is what she was using. She is used those up at this point and is seeking an extension of that prescription to get her through her intolerance of the discomfort of her long-standing umbilical hernia. Objective:  BP 110/70 (BP Location: Right Arm, Patient Position: Sitting, Cuff Size: Large)   Ht 5' 3.5" (1.613 m)   Wt 216 lb (98 kg)   LMP 02/06/2016   BMI 37.66 kg/m  General:Well developed, well nourished.  No acute distress. Abdomen: Bowel sounds normal, soft; small 1 cm reproducible umbilical hernia  Pelvic Exam: not indicated   Incision(s):   Healing well, no drainage, no erythema, no hernia, no swelling, no dehiscenceShe has the long-standing small reducible 1 cm umbilical hernia. Unfortunately the omental fat that is protruding through the long-standing hernia is impacting the area of healing from the 5 mm Trocar site which was below the umbilical hernia. The patient did not want the hernia repaired at the time of the preoperative discussions because it was asymptomatic. At this time it appears that may  have been a missed opportunity for helping her.  Assessment:  Post-Op 3 days s/p laparoscopic bilateral salpingectomy , Healing normally Long-Standing umbilical hernia, leading to increased discomfort due to its close proximity to the laparoscopic umbilical trocar site Arm discomforts, chronic long-standing poorly tolerated by the patient Opiate dependency long-standing  Doing well postoperatively.  Plan:  1. Wound care discussed   2. Current medications.Will extend the patient's oxycodone. We cannot use tramadol as its opiate agonist/antagonist capabilities would interfere with her methadone 3. Activity restrictions: none 4. return to work: now. 5. Follow up in 1 week. I 6. I offered to extend the patient's leave from work until after her postop visit but she declines this as she feels sheMust return back to work By signing my name below, I, Sonum Patel, attest that this documentation has been prepared under the direction and in the presence of Tilda BurrowJohn V Destenie Ingber, MD. Electronically Signed: Sonum Patel, Neurosurgeoncribe. 02/16/16. 2:06 PM.   I personally performed the services described in this documentation, which was SCRIBED in my presence. The recorded information has been reviewed and considered accurate. It has been edited as necessary during review. Tilda BurrowFERGUSON,Lynford Espinoza V, MD

## 2016-02-19 ENCOUNTER — Encounter (HOSPITAL_COMMUNITY): Payer: Self-pay | Admitting: Obstetrics and Gynecology

## 2016-02-25 ENCOUNTER — Ambulatory Visit (INDEPENDENT_AMBULATORY_CARE_PROVIDER_SITE_OTHER): Payer: Medicaid Other | Admitting: Obstetrics and Gynecology

## 2016-02-25 ENCOUNTER — Encounter: Payer: Self-pay | Admitting: Obstetrics and Gynecology

## 2016-02-25 VITALS — BP 128/78 | HR 79 | Ht 63.0 in | Wt 208.0 lb

## 2016-02-25 DIAGNOSIS — Z09 Encounter for follow-up examination after completed treatment for conditions other than malignant neoplasm: Secondary | ICD-10-CM

## 2016-02-25 DIAGNOSIS — Z9889 Other specified postprocedural states: Secondary | ICD-10-CM

## 2016-02-25 MED ORDER — OXYCODONE-ACETAMINOPHEN 5-325 MG PO TABS: 1.0000 | ORAL_TABLET | Freq: Four times a day (QID) | ORAL | 0 refills | Status: DC | PRN

## 2016-02-25 NOTE — Progress Notes (Signed)
Patient ID: Alicia Barnett, female   DOB: 06/18/1976, 40 y.o.   MRN: [id number]   Subjective:  Alicia Corneaicole Colombo is a 40 y.o. female now 1.5 weeks status post laparoscopic bilateral salpingectomy .   Chief Complaint  Patient presents with  . Routine Post Op    tubal ligation  02/13/2016     Pt states she is able to self-reduce her umbilical hernia and her pain has improved. She also complains that her left arm continues to experience intermittent left arm pain and paresthesia, and it affects her sleeping. She states her pain is worsened at night and temporarily alleviated with 600 mg ibuprofen. She states she has finished her rx for Oxycodone and HCTZ. Pt states she takes Methadone at 5 am daily.  Pt is currently bottle feeding.   Review of Systems Negative except painful lump to umbilicus, intermittent pain and aresthesia to left arm    Diet:   normal   Bowel movements : normal.  Pain is somewhat controlled with current analgesics. Medications being used: ibuprofen (OTC).  Objective:  BP 128/78   Pulse 79   Ht 5\' 3"  (1.6 m)   Wt 208 lb (94.3 kg)   LMP 02/06/2016   BMI 36.85 kg/m  General:Well developed, well nourished.  No acute distress. Abdomen: Bowel sounds normal, soft, non-tender, small 1 cm reducible umbilical hernia    Incision(s):   Healing well, no drainage, no erythema, no hernia, no swelling, no dehiscence, 1 cm reducible umbilical hernia      Assessment:  Post-Op 1.5 weeks status post laparoscopic bilateral salpingectomy 1 cm reducible umbilical hernia   Chronic, intermittent left arm paresthesia and pain, tolerated poorly  Chronic Methadone use   Doing well postoperatively.   Plan:  1.Wound care discussed   2. . current medications: Ibuprofen- counseled pt on proper dosing  3. Activity restrictions: none 4. return to work: now. 5. Follow up in [facility] .Marland Kitchen. 6. Pt encouraged to consider Suboxone clinics  7. Given name of Neurologist Dr. Gerilyn Pilgrimoonquah for management of chronic  pain 8. Will provide rx for ibuprofen and 2 weeks Percocet until f/u with Dr. Margo AyeHall    No FURTHER ANALGESIC BY THIS PROVIDER.  By signing my name below, I, Doreatha MartinEva Mathews, attest that this documentation has been prepared under the direction and in the presence of Tilda BurrowJohn V Adda Stokes, MD. Electronically Signed: Doreatha MartinEva Mathews, ED Scribe. 02/25/16. 12:08 PM.  I personally performed the services described in this documentation, which was SCRIBED in my presence. The recorded information has been reviewed and considered accurate. It has been edited as necessary during review. Tilda BurrowFERGUSON,Amandeep Hogston V, MD

## 2016-03-17 ENCOUNTER — Encounter (HOSPITAL_COMMUNITY): Payer: Self-pay | Admitting: Emergency Medicine

## 2016-03-17 ENCOUNTER — Emergency Department (HOSPITAL_COMMUNITY)
Admission: EM | Admit: 2016-03-17 | Discharge: 2016-03-17 | Discharge status: Against Medical Advice | Payer: Medicaid Other | Attending: Emergency Medicine | Admitting: Emergency Medicine

## 2016-03-17 DIAGNOSIS — Z791 Long term (current) use of non-steroidal anti-inflammatories (NSAID): Secondary | ICD-10-CM | POA: Insufficient documentation

## 2016-03-17 DIAGNOSIS — F17210 Nicotine dependence, cigarettes, uncomplicated: Secondary | ICD-10-CM | POA: Diagnosis not present

## 2016-03-17 DIAGNOSIS — Z79899 Other long term (current) drug therapy: Secondary | ICD-10-CM | POA: Insufficient documentation

## 2016-03-17 DIAGNOSIS — G8929 Other chronic pain: Secondary | ICD-10-CM | POA: Insufficient documentation

## 2016-03-17 DIAGNOSIS — M25511 Pain in right shoulder: Secondary | ICD-10-CM | POA: Diagnosis present

## 2016-03-17 NOTE — ED Provider Notes (Signed)
[facility] Provider Note   CSN: [id number] Arrival date & time: 03/17/16  1304     History   Chief Complaint Chief Complaint  Patient presents with  . Medication Refill    HPI Alicia Barnett is a 40 y.o. female.  The history is provided by the patient. No language interpreter was used.  Arm Injury   This is a new problem. The problem occurs constantly. The problem has been gradually improving. The pain is present in the right shoulder and right arm. She has tried nothing for the symptoms. There has been no history of extremity trauma.   Pt reports methadone clinic told her to come here to have an EKG. Pt reports they recently increased her methadone.  Pt also request an RX for percocet.  Pt reports no MD in [facility] will see her and she needs for percocet for pain  Past Medical History:  Diagnosis Date  . Anxiety   . Carpal tunnel syndrome   . Depression   . Polysubstance abuse     Patient Active Problem List   Diagnosis Date Noted  . Encounter for sterilization bilateral salpingectomy 02/13/2016  . Encounter for general counseling and advice on contraceptive management preop btl 02/04/2016  . Postpartum care following vaginal delivery 01/21/2016  . Rubella non-immune status, antepartum 09/10/2015  . Supervision of other high-risk pregnancy 06/17/2015  . History of polysubstance abuse 06/17/2015  . Depression with anxiety 06/17/2015  . History of LEEP (loop electrosurgical excision procedure) of cervix complicating pregnancy 06/17/2015  . Smoker 06/17/2015    Past Surgical History:  Procedure Laterality Date  . LAPAROSCOPIC BILATERAL SALPINGECTOMY Bilateral 02/13/2016   Procedure: LAPAROSCOPIC BILATERAL SALPINGECTOMY;  Surgeon: Tilda Burrow, MD;  Location: [facility];  Service: Gynecology;  Laterality: Bilateral;  . LEEP    . NO PAST SURGERIES      OB History    Gravida Para Term Preterm AB Living   6 3 3   2 3    SAB TAB Ectopic Multiple Live Births      2     3       Home Medications    Prior to Admission medications   Medication Sig Start Date End Date Taking? Authorizing Provider  gabapentin (NEURONTIN) 300 MG capsule Take 1-4 capsules (300-1,200 mg total) by mouth 3 (three) times daily. Take one tablet at lunch, one tablet at dinner, and 2 at bedtime. 02/02/16   Tilda Burrow, MD  ibuprofen (ADVIL,MOTRIN) 200 MG tablet Take 800 mg by mouth every 6 (six) hours as needed.    Historical Provider, MD  methadone (DOLOPHINE) 1 MG/1ML solution Take 130 mg by mouth daily.     Historical Provider, MD  oxyCODONE-acetaminophen (PERCOCET/ROXICET) 5-325 MG tablet Take 1 tablet by mouth every 6 (six) hours as needed for moderate pain. Arm pain at night only 02/25/16   Tilda Burrow, MD  sertraline (ZOLOFT) 50 MG tablet Take 3 tablets (150 mg total) by mouth daily. 01/13/16   Lazaro Arms, MD    Family History Family History  Problem Relation Age of Onset  . Heart failure Mother   . Hypertension Mother   . Cancer Maternal Grandmother     lung  . Diabetes Maternal Grandfather   . Cancer Maternal Grandfather     liver  . Asthma Daughter   . Cancer Other     Social History Social History  Substance Use Topics  . Smoking status: Current Every Day Smoker  Packs/day: 0.50    Years: 23.00    Types: Cigarettes  . Smokeless tobacco: Never Used  . Alcohol use No     Allergies   Codeine   Review of Systems Review of Systems  All other systems reviewed and are negative.    Physical Exam Updated Vital Signs BP 130/76 (BP Location: Left Arm)   Pulse 61   Temp 98.9 F (37.2 C) (Oral)   Resp 16   Ht 5\' 3"  (1.6 m)   Wt 93.4 kg   LMP 03/17/2016   SpO2 100%   BMI 36.49 kg/m   Physical Exam  Constitutional: She appears well-developed and well-nourished. No distress.  HENT:  Head: Normocephalic and atraumatic.  Eyes: Conjunctivae are normal.  Neck: Neck supple.  Cardiovascular: Normal rate and regular rhythm.   No  murmur heard. Pulmonary/Chest: Effort normal and breath sounds normal. No respiratory distress.  Abdominal: Soft. There is no tenderness.  Musculoskeletal: She exhibits no edema.  Neurological: She is alert.  Skin: Skin is warm and dry.  Psychiatric: She has a normal mood and affect.  Nursing note and vitals reviewed.    ED Treatments / Results  Labs (all labs ordered are listed, but only abnormal results are displayed) Labs Reviewed - No data to display  EKG  EKG Interpretation None       Radiology No results found.  Procedures Procedures (including critical care time)  Medications Ordered in ED Medications - No data to display   Initial Impression / Assessment and Plan / ED Course  I have reviewed the triage vital signs and the nursing notes.  Pertinent labs & imaging results that were available during my care of the patient were reviewed by me and considered in my medical decision making (see chart for details).  Clinical Course    Pt advised we can not give Percocet RX.  Pt advised she needs to call medicaid office for informtaion on providers.   Pt left before talking to social worker or having EKG.   Final Clinical Impressions(s) / ED Diagnoses   Final diagnoses:  Chronic pain    New Prescriptions Discharge Medication List as of 03/17/2016  3:02 PM       Elson AreasLeslie K Sequan Auxier, PA-C 03/17/16 1551    Mancel BaleElliott Wentz, MD 03/17/16 1621

## 2016-03-17 NOTE — ED Notes (Signed)
Patient [facility], informed she would not receive any narcotics. Left room without notifying anyone.

## 2016-03-17 NOTE — ED Triage Notes (Addendum)
Pt reports left arm pain and numbness since pregnancy 10 weeks ago. Pt reports was released from OBGYN and referred to PCP and neurologist. Pt reports pain is worse at night and upon waking left arm is numb. Pt reports has been unable to follow up due to office not taking pt insurance.   Pt reports is also in a methadone clinic and reports dose has increased and clinic needs an EKG for pt to be cleared to continue at clinic.

## 2016-05-02 ENCOUNTER — Emergency Department (HOSPITAL_COMMUNITY)
Admission: EM | Admit: 2016-05-02 | Discharge: 2016-05-02 | Disposition: A | Discharge status: Home / Self Care | Payer: Medicaid Other | Attending: Emergency Medicine | Admitting: Emergency Medicine

## 2016-05-02 ENCOUNTER — Emergency Department (HOSPITAL_COMMUNITY): Payer: Medicaid Other

## 2016-05-02 ENCOUNTER — Encounter (HOSPITAL_COMMUNITY): Payer: Self-pay

## 2016-05-02 DIAGNOSIS — Y999 Unspecified external cause status: Secondary | ICD-10-CM | POA: Insufficient documentation

## 2016-05-02 DIAGNOSIS — Y939 Activity, unspecified: Secondary | ICD-10-CM | POA: Diagnosis not present

## 2016-05-02 DIAGNOSIS — S22000A Wedge compression fracture of unspecified thoracic vertebra, initial encounter for closed fracture: Secondary | ICD-10-CM

## 2016-05-02 DIAGNOSIS — Y92410 Unspecified street and highway as the place of occurrence of the external cause: Secondary | ICD-10-CM | POA: Diagnosis not present

## 2016-05-02 DIAGNOSIS — S22080A Wedge compression fracture of T11-T12 vertebra, initial encounter for closed fracture: Secondary | ICD-10-CM | POA: Diagnosis not present

## 2016-05-02 DIAGNOSIS — F17210 Nicotine dependence, cigarettes, uncomplicated: Secondary | ICD-10-CM | POA: Diagnosis not present

## 2016-05-02 DIAGNOSIS — S24109A Unspecified injury at unspecified level of thoracic spinal cord, initial encounter: Secondary | ICD-10-CM | POA: Diagnosis present

## 2016-05-02 LAB — POC URINE PREG, ED: Preg Test, Ur: NEGATIVE

## 2016-05-02 MED ORDER — ACETAMINOPHEN 325 MG PO TABS
650.0000 mg | ORAL_TABLET | Freq: Once | ORAL | Status: AC
  Administered 2016-05-02: 650 mg via ORAL
  Filled 2016-05-02: qty 2

## 2016-05-02 MED ORDER — OXYCODONE-ACETAMINOPHEN 5-325 MG PO TABS: 1.0000 | ORAL_TABLET | ORAL | 0 refills | Status: DC | PRN

## 2016-05-02 MED ORDER — OXYCODONE HCL 5 MG PO TABS
5.0000 mg | ORAL_TABLET | Freq: Once | ORAL | Status: AC
  Administered 2016-05-02: 5 mg via ORAL
  Filled 2016-05-02: qty 1
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
## 2016-05-02 NOTE — Discharge Instructions (Signed)
You have a compression fracture of your vertebrae. I am currently attempting to consult neurosurgery. I understand he must leave at this time, please keep your phone on as I will be calling you with updated information. I advised her to return immediately to the emergency room if he experiences any new or worsening signs or symptoms. Please follow-up with neurosurgeon first thing next week for further evaluation and management. Please use medication only as directed. Do not drink or drive while using medication.
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
## 2016-05-26 ENCOUNTER — Encounter: Payer: Self-pay | Admitting: Family Medicine

## 2016-05-26 ENCOUNTER — Ambulatory Visit (INDEPENDENT_AMBULATORY_CARE_PROVIDER_SITE_OTHER): Payer: Medicaid Other | Admitting: Family Medicine

## 2016-05-26 ENCOUNTER — Ambulatory Visit: Payer: Self-pay | Admitting: Family Medicine

## 2016-05-26 VITALS — BP 110/62 | HR 88 | Temp 99.2°F | Resp 16 | Ht 63.0 in | Wt 207.1 lb

## 2016-05-26 DIAGNOSIS — F1111 Opioid abuse, in remission: Secondary | ICD-10-CM

## 2016-05-26 DIAGNOSIS — Z87898 Personal history of other specified conditions: Secondary | ICD-10-CM | POA: Diagnosis not present

## 2016-05-26 DIAGNOSIS — M67472 Ganglion, left ankle and foot: Secondary | ICD-10-CM | POA: Diagnosis not present

## 2016-05-26 DIAGNOSIS — F1120 Opioid dependence, uncomplicated: Secondary | ICD-10-CM | POA: Diagnosis not present

## 2016-05-26 DIAGNOSIS — Z23 Encounter for immunization: Secondary | ICD-10-CM

## 2016-05-26 MED ORDER — GABAPENTIN 300 MG PO CAPS: 300.0000 mg | ORAL_CAPSULE | Freq: Three times a day (TID) | ORAL | 3 refills | Status: DC

## 2016-05-26 MED ORDER — SERTRALINE HCL 50 MG PO TABS: 150.0000 mg | ORAL_TABLET | Freq: Every day | ORAL | 3 refills | Status: DC

## 2016-05-26 MED ORDER — IBUPROFEN 800 MG PO TABS: 800.0000 mg | ORAL_TABLET | Freq: Three times a day (TID) | ORAL | 0 refills | Status: DC | PRN

## 2016-05-26 NOTE — Patient Instructions (Signed)
Stay away form controlled substances - as much as possible Continue with the rehab program  Try to cut down or stop smoking  Stay on sertraline and gabapentin  Need release for communication with your program

## 2016-05-26 NOTE — Progress Notes (Signed)
Chief Complaint  Patient presents with  . Establish Care   Very complicated opioid addict who is here to establish. She goes to a methadone clinic and takes 170 mg daily She has a history of polysubstance abuse.  Positive drug screens for benzodiazepines and cocaine in the last year along with opioids. Has had SEVERAL prescriptions from different prescribers for opioids in the last six months, oxycodone and hydrocodone for hand pain and back pain and tubal ligation.  Requests percocet from ER.  I explained my concern that she was not fully committed to a narcotic free lifestyle and that she should NOT be given narcotics for this type of pain.   She requests medication for depression and anxiety.  Is on sertraline and gabapentin.  This works for her. I have discussed the multiple health risks associated with cigarette smoking including, but not limited to, cardiovascular disease, lung disease and cancer.  I have strongly recommended that smoking be stopped.  I have reviewed the various methods of quitting including cold [place], classes, nicotine replacements and prescription medications.  I have offered assistance in this difficult process.  The patient is not interested in assistance at this time.    Patient Active Problem List   Diagnosis Date Noted  . Ganglion cyst of left foot 05/26/2016  . Uncomplicated opioid dependence (HCC) 11/30/2015  . Rubella non-immune status, antepartum 09/10/2015  . History of polysubstance abuse 06/17/2015  . Depression with anxiety 06/17/2015  . History of LEEP (loop electrosurgical excision procedure) of cervix complicating pregnancy 06/17/2015  . Smoker 06/17/2015    Outpatient Encounter Prescriptions as of 05/26/2016  Medication Sig  . gabapentin (NEURONTIN) 300 MG capsule Take 1 capsule (300 mg total) by mouth 3 (three) times daily. Take one tablet at lunch, one tablet at dinner, and 2 at bedtime.  . methadone (DOLOPHINE) 1 MG/1ML solution Take 150  mg by mouth daily. [facility]  . sertraline (ZOLOFT) 50 MG tablet Take 3 tablets (150 mg total) by mouth daily.   No facility-administered encounter medications on file as of 05/26/2016.     Past Medical History:  Diagnosis Date  . Anxiety   . Depression   . Polysubstance abuse     Past Surgical History:  Procedure Laterality Date  . LAPAROSCOPIC BILATERAL SALPINGECTOMY Bilateral 02/13/2016   Procedure: LAPAROSCOPIC BILATERAL SALPINGECTOMY;  Surgeon: Tilda BurrowJohn V Ferguson, MD;  Location: [facility];  Service: Gynecology;  Laterality: Bilateral;  . LEEP    . NO PAST SURGERIES    . TUBAL LIGATION      Social History   Social History  . Marital status: Single    Spouse name: Zollie BeckersWalter  . Number of children: 4  . Years of education: 12   Occupational History  . bottle processing    Social History Main Topics  . Smoking status: Current Every Day Smoker    Packs/day: 0.50    Years: 23.00    Types: Cigarettes  . Smokeless tobacco: Never Used  . Alcohol use No  . Drug use: No  . Sexual activity: Yes    Birth control/ protection: Surgical   Other Topics Concern  . Not on file   Social History Narrative   Lives with walter and 425 month old son- Whitney PostLogan   Daughter is there to help   Laurine BlazerWalters mother helps with child care    Family History  Problem Relation Age of Onset  . Heart failure Mother   . Hypertension Mother   .  Pulmonary embolism Mother [id number]    PE after surgery  . Cancer Maternal Grandmother     lung  . Diabetes Maternal Grandfather   . Cancer Maternal Grandfather     liver  . Asthma Daughter   . Cancer Other     Review of Systems  Constitutional: Positive for malaise/fatigue. Negative for chills, fever and weight loss.  HENT: Negative for congestion and hearing loss.   Eyes: Negative for blurred vision and pain.  Respiratory: Negative for cough and shortness of breath.   Cardiovascular: Negative for chest pain and leg swelling.  Gastrointestinal:  Negative for abdominal pain, constipation, diarrhea and heartburn.       Denies narcotic constipation  Genitourinary: Negative for dysuria and frequency.  Musculoskeletal: Positive for back pain. Negative for falls, joint pain and myalgias.  Neurological: Negative for dizziness, seizures and headaches.  Psychiatric/Behavioral: Positive for depression and substance abuse. The patient is nervous/anxious. The patient does not have insomnia.     BP 110/62 (BP Location: Right Arm, Patient Position: Sitting, Cuff Size: Normal)   Pulse 88   Temp 99.2 F (37.3 C) (Oral)   Resp 16   Ht 5\' 3"  (1.6 m)   Wt 207 lb 1.9 oz (93.9 kg)   LMP 05/26/2016 (Exact Date)   SpO2 98%   BMI 36.69 kg/m   Physical Exam  Constitutional: She is oriented to person, place, and time. She appears well-developed and well-nourished.  Obese.  Smells of tobacco  HENT:  Head: Normocephalic and atraumatic.  Mouth/Throat: Oropharynx is clear and moist.  Eyes: Conjunctivae are normal. Pupils are equal, round, and reactive to light.  Cardiovascular: Normal rate, regular rhythm and normal heart sounds.   Pulmonary/Chest: Effort normal and breath sounds normal.  Neurological: She is alert and oriented to person, place, and time.  Skin:  Palpable soft tissue mass dorsum of L foot  Psychiatric:  Poor eye contact.  Argumentative.  Somewhat surly    1. Ganglion cyst of left foot   - Ambulatory referral to Podiatry  2. Need for immunization against influenza  - Flu Vaccine QUAD 36+ mos IM  3. Uncomplicated opioid dependence (HCC) Under care methadone clinic  4. History of polysubstance abuse Expressed my concern regarding ongoing narcotic requests while on high dose methadone.  I will communicate with her program.   60 min spent in discussing her addiction problem and my concerns and recomendations Patient Instructions  Stay away form controlled substances - as much as possible Continue with the rehab  program  Try to cut down or stop smoking  Stay on sertraline and gabapentin  Need release for communication with your program     Eustace MooreYvonne Sue Jeremia Groot, MD

## 2016-06-14 ENCOUNTER — Telehealth: Payer: Self-pay | Admitting: Family Medicine

## 2016-06-14 NOTE — Telephone Encounter (Signed)
error 

## 2016-07-13 ENCOUNTER — Telehealth: Payer: Self-pay | Admitting: Family Medicine

## 2016-07-13 NOTE — Telephone Encounter (Signed)
No notes

## 2016-08-19 ENCOUNTER — Other Ambulatory Visit: Payer: Self-pay | Admitting: Family Medicine

## 2016-08-19 NOTE — Telephone Encounter (Signed)
Seen 11 22 17

## 2016-08-26 ENCOUNTER — Ambulatory Visit: Payer: Medicaid Other | Admitting: Family Medicine

## 2016-09-13 ENCOUNTER — Ambulatory Visit: Payer: Medicaid Other | Admitting: Family Medicine

## 2016-10-11 ENCOUNTER — Other Ambulatory Visit: Payer: Self-pay | Admitting: Family Medicine

## 2016-10-12 ENCOUNTER — Telehealth: Payer: Self-pay | Admitting: Family Medicine

## 2016-10-12 NOTE — Telephone Encounter (Signed)
zoloft was sent in to [facility]... Lincy aware.

## 2016-10-12 NOTE — Telephone Encounter (Signed)
Patient requesting refill on Zoloft. Call in @ [facility]. Patient states she ran out Sunday.  Please let her know when this has been called in.

## 2016-11-09 ENCOUNTER — Other Ambulatory Visit: Payer: Self-pay | Admitting: Family Medicine

## 2016-11-11 ENCOUNTER — Encounter: Payer: Self-pay | Admitting: Family Medicine

## 2016-11-11 ENCOUNTER — Ambulatory Visit (INDEPENDENT_AMBULATORY_CARE_PROVIDER_SITE_OTHER): Payer: Medicaid Other | Admitting: Family Medicine

## 2016-11-11 VITALS — BP 138/74 | HR 80 | Temp 98.9°F | Resp 16 | Ht 63.0 in | Wt 202.0 lb

## 2016-11-11 DIAGNOSIS — F17200 Nicotine dependence, unspecified, uncomplicated: Secondary | ICD-10-CM | POA: Diagnosis not present

## 2016-11-11 DIAGNOSIS — F418 Other specified anxiety disorders: Secondary | ICD-10-CM

## 2016-11-11 DIAGNOSIS — F1120 Opioid dependence, uncomplicated: Secondary | ICD-10-CM

## 2016-11-11 MED ORDER — SERTRALINE HCL 100 MG PO TABS: 200.0000 mg | ORAL_TABLET | Freq: Every day | ORAL | 11 refills | Status: DC

## 2016-11-11 MED ORDER — GABAPENTIN 600 MG PO TABS: 600.0000 mg | ORAL_TABLET | Freq: Three times a day (TID) | ORAL | 11 refills | Status: DC

## 2016-11-11 NOTE — Patient Instructions (Signed)
Try to walk every day that you are able Increase the Zoloft to 100 mg a day Increase the gabapentin to 600 mg three times a day See me in six months Call or test sooner for problems

## 2016-11-11 NOTE — Progress Notes (Signed)
    Chief Complaint  Patient presents with  . Follow-up   Is doing well overall Trying to reduce cigarettes Is tapering methadone Is losing weight Is working full time Is having more anxiety Tearful at times We discussed adjusting her medicines May need referral if this does not help   Patient Active Problem List   Diagnosis Date Noted  . Ganglion cyst of left foot 05/26/2016  . Uncomplicated opioid dependence (HCC) 11/30/2015  . Rubella non-immune status, antepartum 09/10/2015  . History of polysubstance abuse 06/17/2015  . Depression with anxiety 06/17/2015  . History of LEEP (loop electrosurgical excision procedure) of cervix complicating pregnancy 06/17/2015  . Smoker 06/17/2015    Outpatient Encounter Prescriptions as of 11/11/2016  Medication Sig  . ibuprofen (ADVIL,MOTRIN) 800 MG tablet Take 1 tablet (800 mg total) by mouth every 8 (eight) hours as needed.  . methadone (DOLOPHINE) 1 MG/1ML solution Take 150 mg by mouth daily. [facility]  . gabapentin (NEURONTIN) 600 MG tablet Take 1 tablet (600 mg total) by mouth 3 (three) times daily.  . sertraline (ZOLOFT) 100 MG tablet Take 2 tablets (200 mg total) by mouth daily.   No facility-administered encounter medications on file as of 11/11/2016.     Allergies  Allergen Reactions  . Codeine Hives    Review of Systems  Constitutional: Negative for activity change, appetite change and unexpected weight change.  HENT: Negative for congestion, dental problem, postnasal drip and rhinorrhea.   Eyes: Negative for redness and visual disturbance.  Respiratory: Negative for cough and shortness of breath.   Cardiovascular: Negative for chest pain, palpitations and leg swelling.  Gastrointestinal: Positive for nausea. Negative for abdominal pain, constipation and diarrhea.  Genitourinary: Negative for difficulty urinating, frequency and menstrual problem.  Musculoskeletal: Negative for arthralgias and back pain.    Neurological: Negative for dizziness and headaches.  Psychiatric/Behavioral: Positive for dysphoric mood. Negative for sleep disturbance. The patient is nervous/anxious.     BP 138/74 (BP Location: Right Arm, Patient Position: Sitting, Cuff Size: Normal)   Pulse 80   Temp 98.9 F (37.2 C) (Temporal)   Resp 16   Ht 5\' 3"  (1.6 m)   Wt 202 lb (91.6 kg)   SpO2 98%   BMI 35.78 kg/m   Physical Exam  Constitutional: She is oriented to person, place, and time. She appears well-developed and well-nourished. She appears distressed.  tearful  HENT:  Head: Normocephalic and atraumatic.  Nose: Nose normal.  Mouth/Throat: Oropharynx is clear and moist.  Eyes: Conjunctivae are normal. Pupils are equal, round, and reactive to light.  Neck: Normal range of motion.  Cardiovascular: Normal rate and regular rhythm.   Pulmonary/Chest: Effort normal and breath sounds normal.  Lymphadenopathy:    She has no cervical adenopathy.  Neurological: She is alert and oriented to person, place, and time.  Psychiatric: Her behavior is normal. Thought content normal.  Articulate.  Good judgement    ASSESSMENT/PLAN:  1. Depression with anxiety Increasing zoloft to 200 mg a day.  If this does not help may need to change  2. Smoker decreasing  3. Uncomplicated opioid dependence (HCC) Going to methadone clinic and compliant   Patient Instructions  Try to walk every day that you are able Increase the Zoloft to 100 mg a day Increase the gabapentin to 600 mg three times a day See me in six months Call or test sooner for problems    Eustace MooreYvonne Sue Hamid Brookens, MD

## 2016-11-30 ENCOUNTER — Telehealth: Payer: Self-pay | Admitting: Family Medicine

## 2016-11-30 NOTE — Telephone Encounter (Signed)
Left message for patient to return my call, she left a msg on the voicemail

## 2016-12-07 ENCOUNTER — Emergency Department (HOSPITAL_COMMUNITY)
Admission: EM | Admit: 2016-12-07 | Discharge: 2016-12-07 | Disposition: A | Discharge status: Home / Self Care | Payer: Medicaid Other | Attending: Emergency Medicine | Admitting: Emergency Medicine

## 2016-12-07 ENCOUNTER — Encounter (HOSPITAL_COMMUNITY): Payer: Self-pay | Admitting: Emergency Medicine

## 2016-12-07 ENCOUNTER — Emergency Department (HOSPITAL_COMMUNITY): Payer: Medicaid Other

## 2016-12-07 DIAGNOSIS — Z79899 Other long term (current) drug therapy: Secondary | ICD-10-CM | POA: Insufficient documentation

## 2016-12-07 DIAGNOSIS — R1032 Left lower quadrant pain: Secondary | ICD-10-CM | POA: Diagnosis present

## 2016-12-07 DIAGNOSIS — M5414 Radiculopathy, thoracic region: Secondary | ICD-10-CM | POA: Insufficient documentation

## 2016-12-07 DIAGNOSIS — F17210 Nicotine dependence, cigarettes, uncomplicated: Secondary | ICD-10-CM | POA: Insufficient documentation

## 2016-12-07 DIAGNOSIS — R109 Unspecified abdominal pain: Secondary | ICD-10-CM

## 2016-12-07 LAB — CBC WITH DIFFERENTIAL/PLATELET
BASOS PCT: 0 %
Basophils Absolute: 0 10*3/uL (ref 0.0–0.1)
EOS ABS: 0 10*3/uL (ref 0.0–0.7)
Eosinophils Relative: 0 %
HCT: 39.3 % (ref 36.0–46.0)
HEMOGLOBIN: 13.4 g/dL (ref 12.0–15.0)
Lymphocytes Relative: 16 %
Lymphs Abs: 1.5 10*3/uL (ref 0.7–4.0)
MCH: 30.3 pg (ref 26.0–34.0)
MCHC: 34.1 g/dL (ref 30.0–36.0)
MCV: 88.9 fL (ref 78.0–100.0)
Monocytes Absolute: 0.8 10*3/uL (ref 0.1–1.0)
Monocytes Relative: 9 %
NEUTROS PCT: 75 %
Neutro Abs: 7.1 10*3/uL (ref 1.7–7.7)
Platelets: 325 10*3/uL (ref 150–400)
RBC: 4.42 MIL/uL (ref 3.87–5.11)
RDW: 14 % (ref 11.5–15.5)
WBC: 9.5 10*3/uL (ref 4.0–10.5)

## 2016-12-07 LAB — URINALYSIS, ROUTINE W REFLEX MICROSCOPIC
Bilirubin Urine: NEGATIVE
Glucose, UA: NEGATIVE mg/dL
Hgb urine dipstick: NEGATIVE
KETONES UR: NEGATIVE mg/dL
LEUKOCYTES UA: NEGATIVE
NITRITE: NEGATIVE
PH: 6 (ref 5.0–8.0)
PROTEIN: NEGATIVE mg/dL
Specific Gravity, Urine: <1.005 — ABNORMAL LOW (ref 1.005–1.030)

## 2016-12-07 LAB — BASIC METABOLIC PANEL
ANION GAP: 10 (ref 5–15)
BUN: 7 mg/dL (ref 6–20)
CHLORIDE: 105 mmol/L (ref 101–111)
CO2: 23 mmol/L (ref 22–32)
Calcium: 9.2 mg/dL (ref 8.9–10.3)
Creatinine, Ser: 0.77 mg/dL (ref 0.44–1.00)
GFR calc Af Amer: >60 mL/min (ref >60)
Glucose, Bld: 90 mg/dL (ref 65–99)
POTASSIUM: 4.5 mmol/L (ref 3.5–5.1)
SODIUM: 138 mmol/L (ref 135–145)

## 2016-12-07 LAB — POC URINE PREG, ED: Preg Test, Ur: NEGATIVE

## 2016-12-07 MED ORDER — KETOROLAC TROMETHAMINE 15 MG/ML IJ SOLN
15.0000 mg | Freq: Once | INTRAMUSCULAR | Status: AC
  Administered 2016-12-07: 15 mg via INTRAVENOUS
  Filled 2016-12-07: qty 1

## 2016-12-07 MED ORDER — TIZANIDINE HCL 4 MG PO CAPS: 4.0000 mg | ORAL_CAPSULE | Freq: Three times a day (TID) | ORAL | 0 refills | Status: DC | PRN

## 2016-12-07 MED ORDER — METHYLPREDNISOLONE 4 MG PO TBPK: ORAL_TABLET | ORAL | 0 refills | Status: DC

## 2016-12-07 MED ORDER — SODIUM CHLORIDE 0.9 % IV BOLUS (SEPSIS)
1000.0000 mL | Freq: Once | INTRAVENOUS | Status: AC
  Administered 2016-12-07: 1000 mL via INTRAVENOUS

## 2016-12-07 MED ORDER — FENTANYL CITRATE (PF) 100 MCG/2ML IJ SOLN
75.0000 ug | Freq: Once | INTRAMUSCULAR | Status: AC
  Administered 2016-12-07: 75 ug via INTRAVENOUS
  Filled 2016-12-07: qty 2

## 2016-12-07 NOTE — ED Provider Notes (Signed)
[facility]ENCY DEPT Provider Note   CSN: [id number] Arrival date & time: 12/07/16  [id number]     History   Chief Complaint Chief Complaint  Patient presents with  . Flank Pain    HPI Corene Corneaicole Odeh is a 41 y.o. female.  HPI   41 yo F with PMHx opioid dependence on methadone here with flank pain. Pt states that her sx started 24 hours ago with gradual onset of progressively worsening aching, throbbing, positional left flank pain. Pain has worsened and radiated down toward left lower quadrant. She has associated urinary urgency and frequency but no overt dysuria. She denies any fevers. No nausea or vomiting. She was seen at methadone clinic today and was sent here for further evaluation. No other abd pain. No chest pain or SOB. No history of kidney stones. No vaginal bleeding or discharge.  Past Medical History:  Diagnosis Date  . Anxiety   . Depression   . Polysubstance abuse     Patient Active Problem List   Diagnosis Date Noted  . Ganglion cyst of left foot 05/26/2016  . Uncomplicated opioid dependence (HCC) 11/30/2015  . Rubella non-immune status, antepartum 09/10/2015  . History of polysubstance abuse 06/17/2015  . Depression with anxiety 06/17/2015  . History of LEEP (loop electrosurgical excision procedure) of cervix complicating pregnancy 06/17/2015  . Smoker 06/17/2015    Past Surgical History:  Procedure Laterality Date  . LAPAROSCOPIC BILATERAL SALPINGECTOMY Bilateral 02/13/2016   Procedure: LAPAROSCOPIC BILATERAL SALPINGECTOMY;  Surgeon: Tilda BurrowJohn V Ferguson, MD;  Location: [facility];  Service: Gynecology;  Laterality: Bilateral;  . LEEP    . NO PAST SURGERIES    . TUBAL LIGATION      OB History    Gravida Para Term Preterm AB Living   6 3 3   2 3    SAB TAB Ectopic Multiple Live Births     2     3       Home Medications    Prior to Admission medications   Medication Sig Start Date End Date Taking? Authorizing Provider  gabapentin (NEURONTIN) 300 MG capsule Take  600 mg by mouth 3 (three) times daily.   Yes [provider]  ibuprofen (ADVIL,MOTRIN) 800 MG tablet Take 1 tablet (800 mg total) by mouth every 8 (eight) hours as needed. 05/26/16  Yes Eustace MooreNelson, Yvonne Sue, MD  methadone (DOLOPHINE) 1 MG/1ML solution Take 120 mg by mouth daily. [facility]   Yes [provider]  sertraline (ZOLOFT) 100 MG tablet Take 2 tablets (200 mg total) by mouth daily. 11/11/16  Yes Eustace MooreNelson, Yvonne Sue, MD  gabapentin (NEURONTIN) 600 MG tablet Take 1 tablet (600 mg total) by mouth 3 (three) times daily. Patient not taking: Reported on 12/07/2016 11/11/16   Eustace MooreNelson, Yvonne Sue, MD  methylPREDNISolone (MEDROL DOSEPAK) 4 MG TBPK tablet Take as directed on package 12/07/16   Shaune PollackIsaacs, Antwain Caliendo, MD  tiZANidine (ZANAFLEX) 4 MG capsule Take 1 capsule (4 mg total) by mouth 3 (three) times daily as needed for muscle spasms. 12/07/16   Shaune PollackIsaacs, Lysle Yero, MD    Family History Family History  Problem Relation Age of Onset  . Heart failure Mother   . Hypertension Mother   . Pulmonary embolism Mother [id number]       PE after surgery  . Cancer Maternal Grandmother        lung  . Diabetes Maternal Grandfather   . Cancer Maternal Grandfather        liver  . Asthma Daughter   .  Cancer Other     Social History Social History  Substance Use Topics  . Smoking status: Current Every Day Smoker    Packs/day: 0.50    Years: 23.00    Types: Cigarettes  . Smokeless tobacco: Never Used  . Alcohol use No     Allergies   Codeine   Review of Systems Review of Systems  Constitutional: Positive for fatigue. Negative for fever.  Genitourinary: Positive for flank pain and frequency.  All other systems reviewed and are negative.    Physical Exam Updated Vital Signs BP 107/68   Pulse 61   Temp 97.9 F (36.6 C) (Oral)   Resp 18   Ht 5' 3.5" (1.613 m)   Wt 90.7 kg (200 lb)   LMP 11/23/2016 (Approximate)   SpO2 95%   BMI 34.87 kg/m   Physical Exam    Constitutional: She is oriented to person, place, and time. She appears well-developed and well-nourished. No distress.  HENT:  Head: Normocephalic and atraumatic.  Eyes: Conjunctivae are normal.  Neck: Neck supple.  Cardiovascular: Normal rate, regular rhythm and normal heart sounds.  Exam reveals no friction rub.   No murmur heard. Pulmonary/Chest: Effort normal and breath sounds normal. No respiratory distress. She has no wheezes. She has no rales.  Abdominal: Soft. She exhibits no distension. There is tenderness.  Moderate LUQ and L flank/CVAT. No guarding or rebound. No rigidity.  Musculoskeletal: She exhibits no edema.  Neurological: She is alert and oriented to person, place, and time. She exhibits normal muscle tone.  Skin: Skin is warm. Capillary refill takes less than 2 seconds.  Psychiatric: She has a normal mood and affect.  Nursing note and vitals reviewed.    ED Treatments / Results  Labs (all labs ordered are listed, but only abnormal results are displayed) Labs Reviewed  URINALYSIS, ROUTINE W REFLEX MICROSCOPIC - Abnormal; Notable for the following:       Result Value   Specific Gravity, Urine <1.005 (*)    All other components within normal limits  CBC WITH DIFFERENTIAL/PLATELET  BASIC METABOLIC PANEL  POC URINE PREG, ED    EKG  EKG Interpretation None       Radiology Ct Renal Stone Study  Result Date: 12/07/2016 CLINICAL DATA:  Left flank pain EXAM: CT ABDOMEN AND PELVIS WITHOUT CONTRAST TECHNIQUE: Multidetector CT imaging of the abdomen and pelvis was performed following the standard protocol without oral or intravenous contrast material administration. COMPARISON:  Thoracic and lumbar spine CT May 02, 2016 FINDINGS: Lower chest: Lung bases are clear. Hepatobiliary: There is a cyst near the dome of the liver on the right measuring 1.1 x 0.8 cm. No other focal liver lesion is evident on this noncontrast enhanced study. Liver measures 19.7 cm in length.  Gallbladder wall is not appreciably thickened. There is no biliary duct dilatation. Pancreas: There is no pancreatic mass or inflammatory focus. Spleen: No splenic lesions are evident. Adrenals/Urinary Tract: Adrenals appear normal bilaterally. There is no renal mass or hydronephrosis on either side. There is no renal or ureteral calculus on either side. The urinary bladder is midline with wall thickness within normal limits. There are phleboliths in the pelvis near but separate from the distal ureters. Stomach/Bowel: There are occasional sigmoid diverticula without diverticulitis. There is no bowel wall thickening or mesenteric thickening. No bowel obstruction. No free air or portal venous air. Vascular/Lymphatic: There is no abdominal aortic aneurysm. No vascular lesion is evident on this noncontrast enhanced study. No adenopathy is  appreciable in the abdomen or pelvis. Reproductive: Uterus is anteverted.  There is no pelvic mass. Other: Appendix appears normal. There is no ascites or abscess in the abdomen or pelvis. There is a small ventral hernia containing only fat. Musculoskeletal: There is chronic anterior wedging of the T12 vertebral body. There is milder chronic anterior wedging at T11. There is disc degeneration at several levels in the lower thoracic region with Schmorl's nodes in this area. There is also degenerative change at L5-S1. There are no blastic or lytic bone lesions. No intramuscular or abdominal wall lesion evident. IMPRESSION: No evident renal or ureteral calculus.  No hydronephrosis. No bowel obstruction. No abscess. Appendix appears normal. There are occasional sigmoid diverticular without diverticulitis. Liver prominent. No focal liver lesions beyond a small cyst near the dome. Small ventral hernia containing only fat. Evidence of old trauma in the lower thoracic region. Disc degeneration at several levels in the lower thoracic and lower lumbar regions. Electronically Signed   By: Bretta Bang III M.D.   On: 12/07/2016 10:48    Procedures Procedures (including critical care time)  Medications Ordered in ED Medications  ketorolac (TORADOL) 15 MG/ML injection 15 mg (15 mg Intravenous Given 12/07/16 0856)  fentaNYL (SUBLIMAZE) injection 75 mcg (75 mcg Intravenous Given 12/07/16 0854)  sodium chloride 0.9 % bolus 1,000 mL (0 mLs Intravenous Stopped 12/07/16 1151)     Initial Impression / Assessment and Plan / ED Course  I have reviewed the triage vital signs and the nursing notes.  Pertinent labs & imaging results that were available during my care of the patient were reviewed by me and considered in my medical decision making (see chart for details).    41 year old female here with left flank pain. I suspect this is secondary to thoracic radiculopathy from known chronic thoracic fractures versus paraspinal strain. Urinalysis is without evidence of UTI or stone. CT stone study is negative and otherwise shows no acute abnormalities. Her screening lab work is unremarkable. Given that she is already on methadone, will place on adjunctive analgesia and discharge with outpatient follow-up.   Final Clinical Impressions(s) / ED Diagnoses   Final diagnoses:  Left flank pain  Thoracic radiculopathy    New Prescriptions Discharge Medication List as of 12/07/2016 11:24 AM    START taking these medications   Details  methylPREDNISolone (MEDROL DOSEPAK) 4 MG TBPK tablet Take as directed on package, Print    tiZANidine (ZANAFLEX) 4 MG capsule Take 1 capsule (4 mg total) by mouth 3 (three) times daily as needed for muscle spasms., Starting Tue 12/07/2016, Print         Shaune Pollack, MD 12/07/16 [phone]

## 2016-12-07 NOTE — ED Notes (Signed)
Pt refused discharge vitals. Pt A&OX4, ambulatory at d/c with steady gait, NAD

## 2016-12-07 NOTE — ED Triage Notes (Signed)
Pt arrives from home reporting L flank pain, worse with movement, increased urinary frequency, denies dysuria, N/V, CP, SOB.

## 2016-12-22 ENCOUNTER — Telehealth: Payer: Self-pay | Admitting: *Deleted

## 2016-12-22 NOTE — Telephone Encounter (Signed)
Patient called stating she was seen in the ER and they done a ultrasound on her back and told her she needs to see a orthopedic. Please advise patient [phone]

## 2016-12-22 NOTE — Telephone Encounter (Signed)
Will the patient need to be seen in our office first or can a referral be made?
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
## 2016-12-22 NOTE — Telephone Encounter (Signed)
lmtcb

## 2016-12-27 NOTE — Telephone Encounter (Signed)
Patient is scheduled for tomorrow 12/28/16 at 10:40.

## 2016-12-28 ENCOUNTER — Ambulatory Visit (INDEPENDENT_AMBULATORY_CARE_PROVIDER_SITE_OTHER): Payer: Medicaid Other | Admitting: Family Medicine

## 2016-12-28 ENCOUNTER — Encounter: Payer: Self-pay | Admitting: Family Medicine

## 2016-12-28 VITALS — BP 136/82 | HR 88 | Temp 97.7°F | Resp 18 | Ht 63.0 in | Wt 200.0 lb

## 2016-12-28 DIAGNOSIS — S22000A Wedge compression fracture of unspecified thoracic vertebra, initial encounter for closed fracture: Secondary | ICD-10-CM

## 2016-12-28 DIAGNOSIS — F418 Other specified anxiety disorders: Secondary | ICD-10-CM | POA: Diagnosis not present

## 2016-12-28 DIAGNOSIS — M4854XA Collapsed vertebra, not elsewhere classified, thoracic region, initial encounter for fracture: Secondary | ICD-10-CM

## 2016-12-28 DIAGNOSIS — G8929 Other chronic pain: Secondary | ICD-10-CM | POA: Diagnosis not present

## 2016-12-28 DIAGNOSIS — M546 Pain in thoracic spine: Secondary | ICD-10-CM

## 2016-12-28 MED ORDER — DULOXETINE HCL 60 MG PO CPEP: 60.0000 mg | ORAL_CAPSULE | Freq: Every day | ORAL | 3 refills | Status: DC

## 2016-12-28 MED ORDER — CLONAZEPAM 0.5 MG PO TABS: 0.5000 mg | ORAL_TABLET | Freq: Two times a day (BID) | ORAL | 0 refills | Status: DC | PRN

## 2016-12-28 MED ORDER — GABAPENTIN 300 MG PO CAPS: 600.0000 mg | ORAL_CAPSULE | Freq: Three times a day (TID) | ORAL | 3 refills | Status: DC
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
## 2016-12-28 NOTE — Patient Instructions (Signed)
Increase the gabapentin as tolerated to 600 mg three times a day Stop the sertraline Start the duloxetine Take clonazepam for emergencies  See me in 3-4 weeks

## 2017-01-07 ENCOUNTER — Other Ambulatory Visit: Payer: Self-pay | Admitting: Family Medicine

## 2017-01-07 MED ORDER — TIZANIDINE HCL 4 MG PO CAPS: 4.0000 mg | ORAL_CAPSULE | Freq: Three times a day (TID) | ORAL | 0 refills | Status: DC | PRN

## 2017-01-07 NOTE — Telephone Encounter (Signed)
noted 

## 2017-01-07 NOTE — Telephone Encounter (Signed)
Patient would like to know the status of the referral.  She is feeling desperate.  She has just left a 45 minute session with her therapist.  She is detoxing and feels like she is getting her life back, but the pain she is experiencing in back makes her want to going and do something that she shouldn't.  Her therapist advised her to call her PCP.

## 2017-01-07 NOTE — Telephone Encounter (Signed)
Alicia Barnett was referred to [facility] from the ER.  She was given their number to call.  When we discussed it, I agreed with a specialist referral, but did not make an appointment for her. I have done what I could for her pain management and anxiety issues.  If the previous medrol dosepak helped her, I will refill.  I will not refill the Klonopin.  If her pain is not manageable, she needs to talk to her methadone provider about adjusting her taper.  She can slow down the methadone taper ( stay on the methadone longer)  if it helps with pain and she does not have a panic.

## 2017-01-07 NOTE — Telephone Encounter (Signed)
Called and spoke to Kamariah. Given mds advice. Stated klonopin did help her with her back pain, but shes " not going to beg for them" does not want medrol.. States it doesn't help her. Did ask for zanaflex.. States it takes the edge off. Is frustrated d/t her past and feels no one will help her. States she had a shot of Toradol once that helped.Also said she feels like she should " call my friend and buy 2 hydros 7.5" . Stated she will call [facility] today to make appt.

## 2017-01-10 ENCOUNTER — Telehealth: Payer: Self-pay | Admitting: Family Medicine

## 2017-01-10 NOTE — Telephone Encounter (Signed)
Patient left message on nurse line. States that she just can't take it anymore, she is in a lot of pain, and doesn't sleep at night. States she has been taking muscle relaxer rx'd to her Friday, and she has talk to someone at [facility] for referral. She states that she has appt 24th, but wants to know if she can move appt sooner, states 'I just can't let this go.'

## 2017-01-10 NOTE — Telephone Encounter (Signed)
Called and talked to Alicia Barnett. States when she called for appt at the [facility] they said they would call her when they could give her an appt. Was wondering if there was something you could do to speed this along?

## 2017-01-11 NOTE — Telephone Encounter (Signed)
Called patient regarding message below. No answer, left generic message for patient to return call.  Mobile number leads to recording that number is out of service but left message at home number.

## 2017-01-11 NOTE — Telephone Encounter (Signed)
Patient informed of message below, verbalized understanding. States she has been on as much as 200 mg of methadone and it has not helped her one bit. She states in Oct she went to ED thinking she had kidney stones. States it hurts her to even have BM. Has not heard from Spine dr. She states she is taking ibuprofen 'like candy' and zanaflex and klonopin as directed. States the bottom of her back hurts so bad, she can't even make her bed. States that she talked to her therapist Friday and she is trying to do the right thing and that it seems like no one wants to help her. She is tired of the pain and doesn't know what else to do.   Please advise.

## 2017-01-11 NOTE — Telephone Encounter (Signed)
Cannot get her in before the 24th.  She can ask her methadone provider to increase her med is she cannot stand the pain.  [facility]

## 2017-01-13 NOTE — Telephone Encounter (Signed)
Patient left message on nurse line. States she called Fri, Mon, Tues, and hasn't really heard anything about whether she can come in sooner than 24th, or if we have been in contact with [facility]. She states she took klonopin for 5 days and for 5 days straight she was able to get some sleep. She states it's not fair to anyone that she is is chronic pain, it's her fault she is an addict, but she is not trying to get pain pills. It was 'just nice to get sleep for 5 days.' She states it is worse right before bedtime and first thing in the morning. She states we have evidence from CT/x-ray in the last year.

## 2017-01-13 NOTE — Telephone Encounter (Signed)
Patient informed of message below, verbalized understanding. She states last time she went to ER was less than a month ago. She also states that she did what she was told to do- she contacted her PCP 3 times.

## 2017-01-13 NOTE — Telephone Encounter (Signed)
I will not prescribe controlled substances to her, pain medicine or klonopin.  I have personally reviewed the scans done in October and again last month, and although there are findings, there is nothing urgent.  I cnnot get her into the spine center sooner than the 24th.  She can have an appt here, if I have one next week.  Otherwise go to the ER.

## 2017-01-19 IMAGING — CT CT ANGIO CHEST
2 of 6 series · 18 of 46 positions shown · IV contrast (ISOVUE)
Comparison: Chest radiographs obtained earlier today.

CLINICAL DATA: Hypoxia, chest pain and shortness of breath for
several days. Thirty-five weeks pregnant.

EXAM:
CT ANGIOGRAPHY CHEST WITH CONTRAST
TECHNIQUE: Multidetector CT imaging of the chest was performed using the
standard protocol during bolus administration of intravenous
contrast. Multiplanar CT image reconstructions and MIPs were
obtained to evaluate the vascular anatomy.
CONTRAST:  100 cc Isovue 370

[Series 5: pe thins 1.0 · axial · 0.71mm/px · z∈[-206,-6]mm · 16 of 223 slices shown]
[im 12/223  lung]
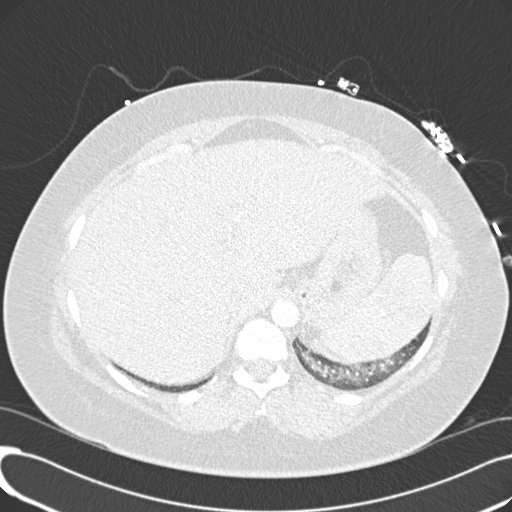
[im 23/223  soft-tissue]
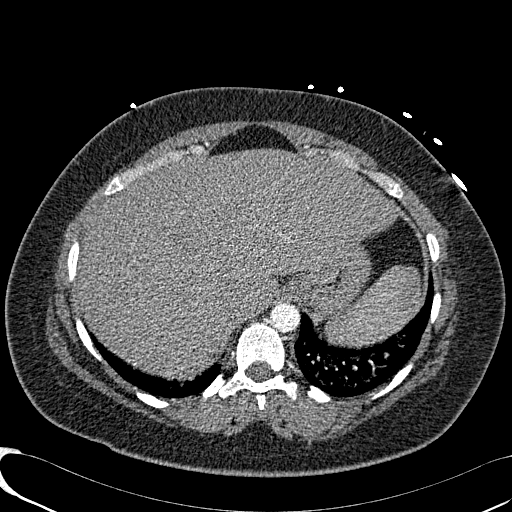
[im 34/223  lung]
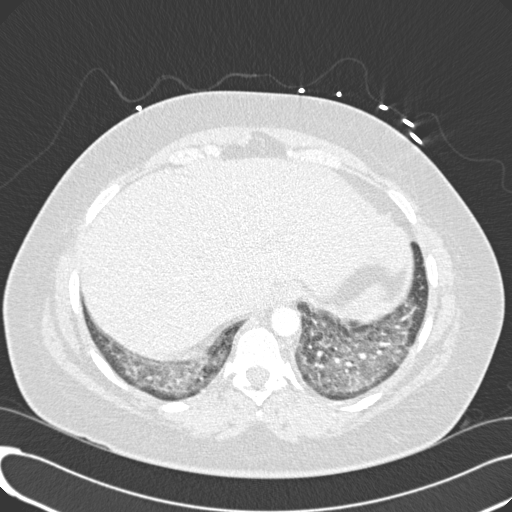
[im 56/223  soft-tissue]
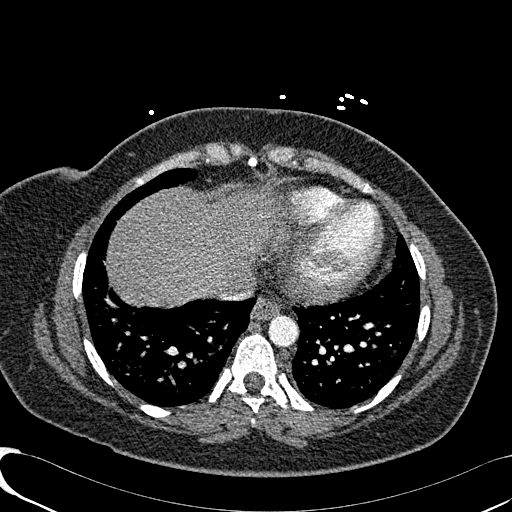
[im 67/223  lung]
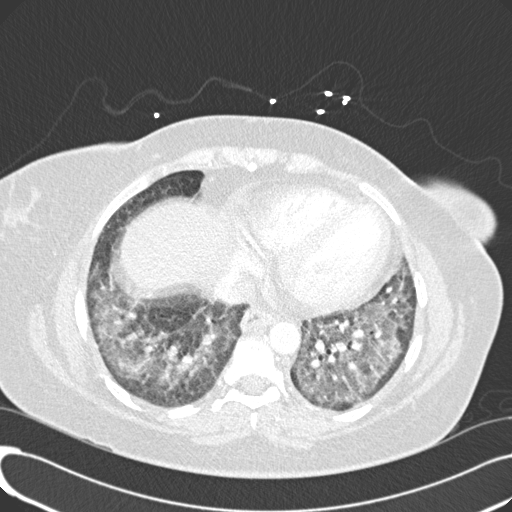
[im 78/223  soft-tissue]
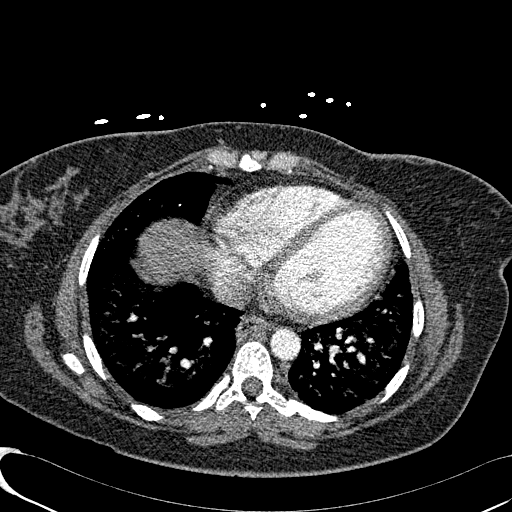
[im 89/223  lung]
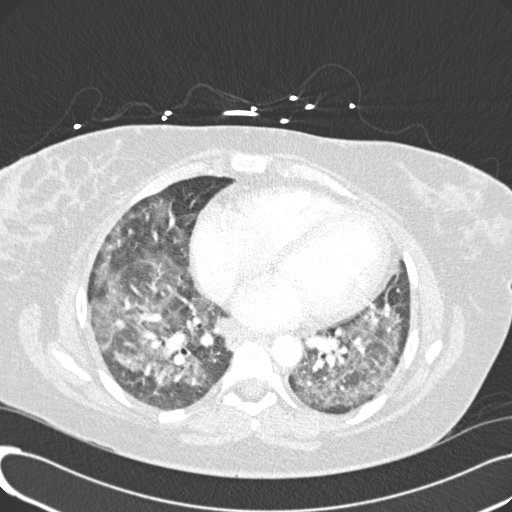
[im 100/223  soft-tissue]
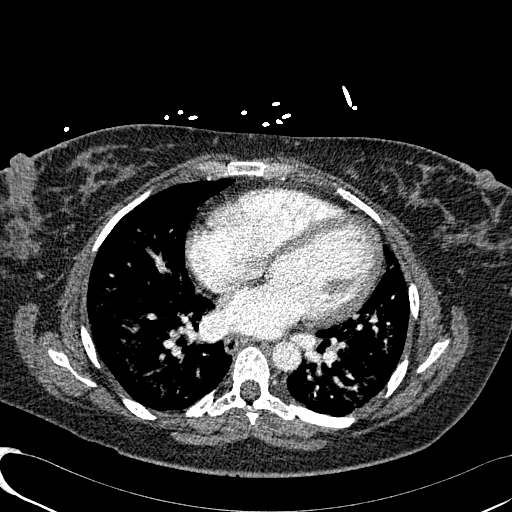
[im 123/223  lung]
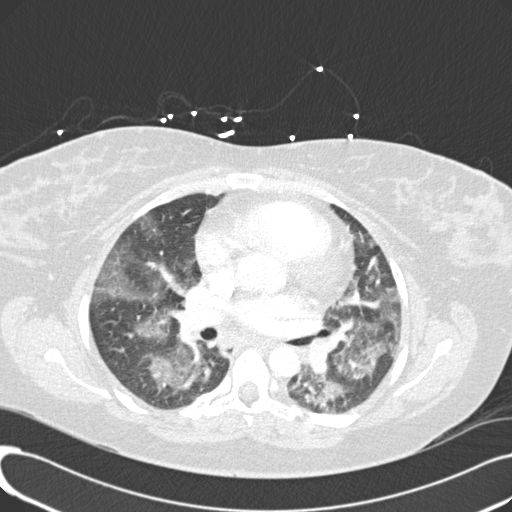
[im 134/223  soft-tissue]
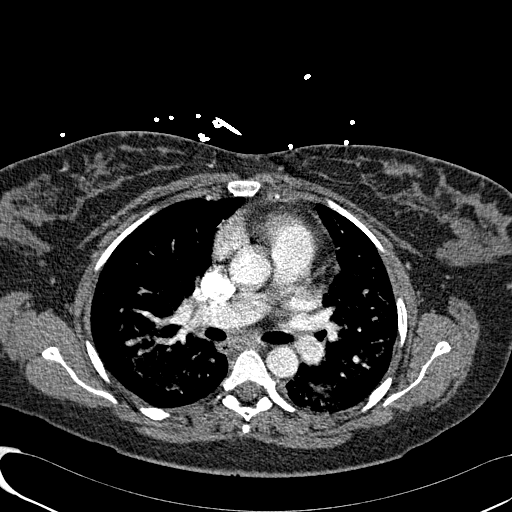
[im 145/223  lung]
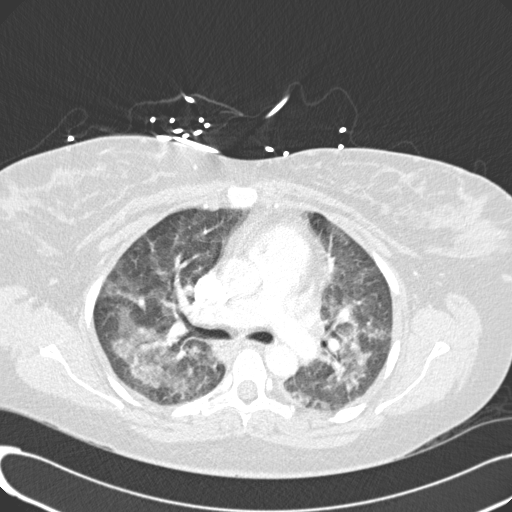
[im 156/223  soft-tissue]
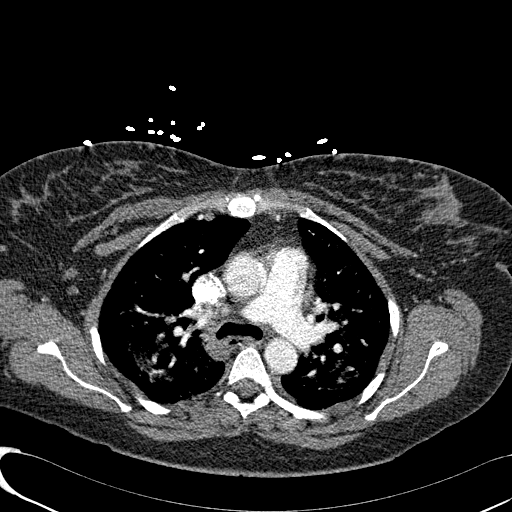
[im 167/223  lung]
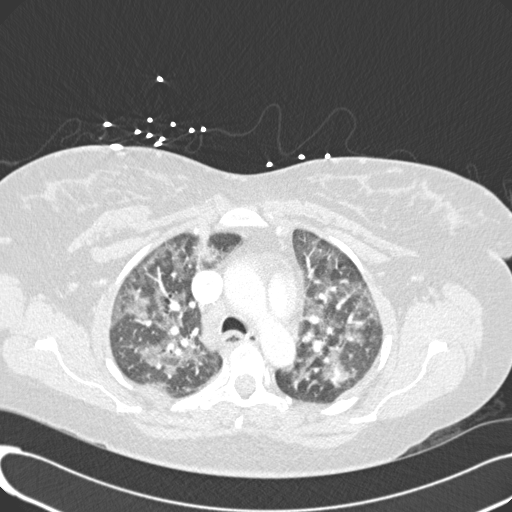
[im 189/223  soft-tissue]
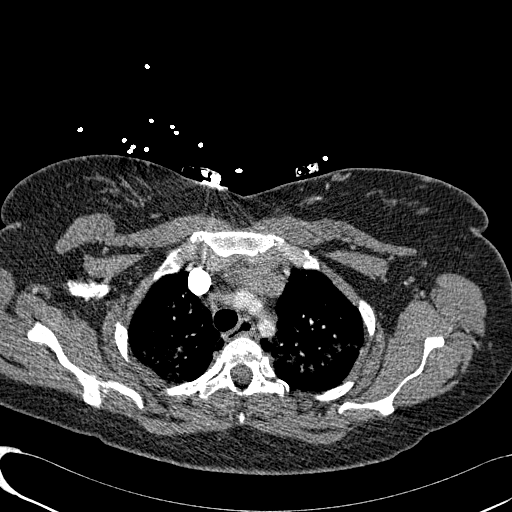
[im 200/223  lung]
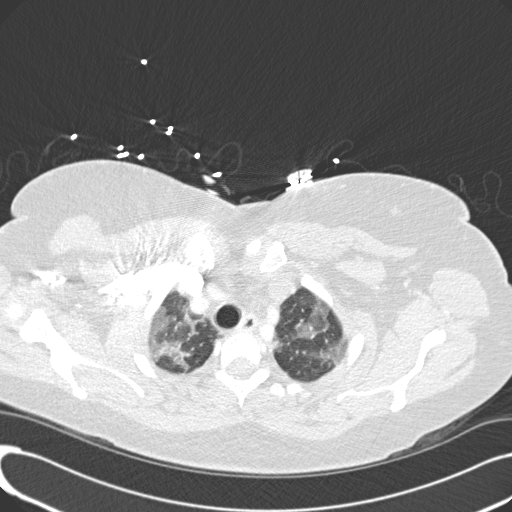
[im 211/223  soft-tissue]
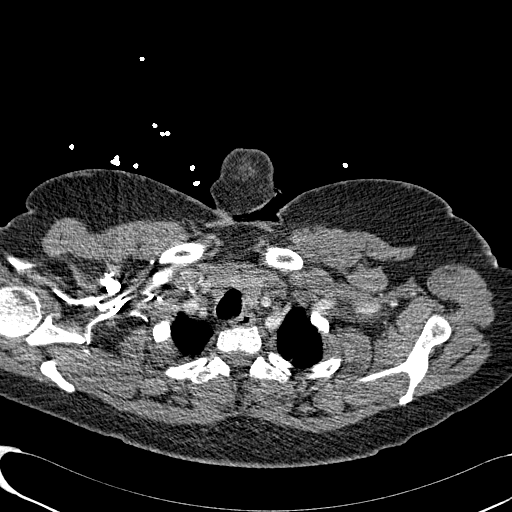

[Series 7: cor mpr 2.0 · coronal · 0.46mm/px · 2 of 118 slices shown]
[im 40/118  soft-tissue]
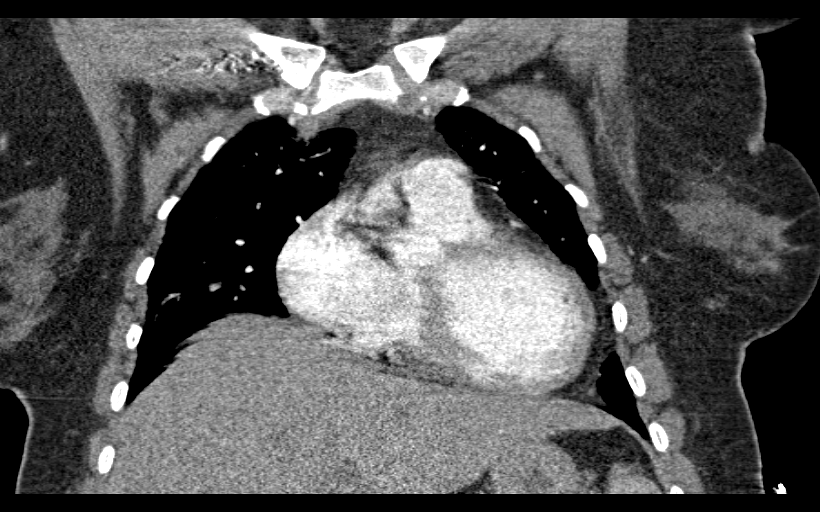
[im 79/118  soft-tissue]
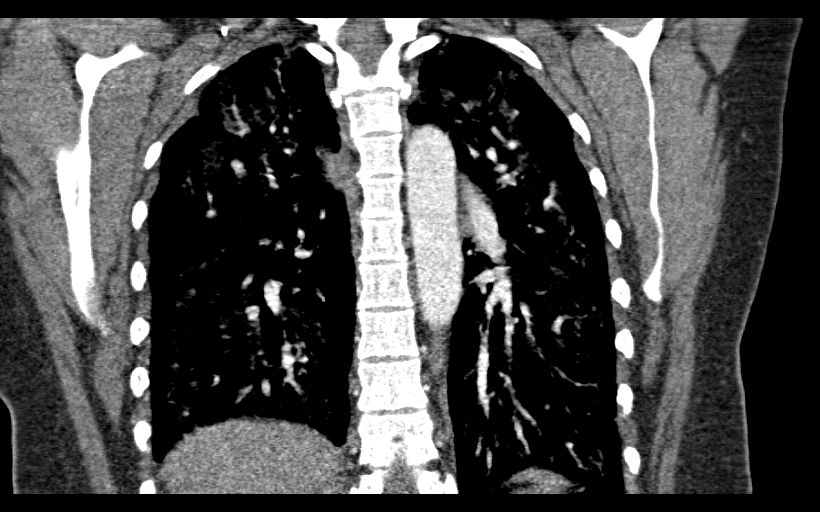

[18 of 46 positions shown; findings below may reference images not displayed]

FINDINGS: Mediastinum/Lymph Nodes: No pulmonary emboli or thoracic aortic
dissection identified. No masses or pathologically enlarged lymph
nodes identified.

Lungs/Pleura: Extensive patchy interstitial prominence throughout
the majority of both lungs. No pleural fluid. Borderline
cardiomegaly.

Upper abdomen: No acute findings.

Musculoskeletal: Mild thoracic spine degenerative changes.

Review of the MIP images confirms the above findings.
IMPRESSION: 1. Extensive patchy interstitial prominence throughout the majority
of both lungs. Differential considerations include interstitial
pulmonary edema and interstitial pneumonitis.
2. No pulmonary emboli.
3. Borderline cardiomegaly.

## 2017-01-25 ENCOUNTER — Ambulatory Visit (INDEPENDENT_AMBULATORY_CARE_PROVIDER_SITE_OTHER): Payer: Medicaid Other | Admitting: Family Medicine

## 2017-01-25 ENCOUNTER — Encounter: Payer: Self-pay | Admitting: Family Medicine

## 2017-01-25 VITALS — BP 136/88 | HR 84 | Temp 97.6°F | Resp 16 | Ht 63.0 in | Wt 196.1 lb

## 2017-01-25 DIAGNOSIS — M4854XA Collapsed vertebra, not elsewhere classified, thoracic region, initial encounter for fracture: Secondary | ICD-10-CM | POA: Diagnosis not present

## 2017-01-25 DIAGNOSIS — S22000A Wedge compression fracture of unspecified thoracic vertebra, initial encounter for closed fracture: Secondary | ICD-10-CM

## 2017-01-25 DIAGNOSIS — G4700 Insomnia, unspecified: Secondary | ICD-10-CM

## 2017-01-25 MED ORDER — GABAPENTIN 400 MG PO CAPS: 800.0000 mg | ORAL_CAPSULE | Freq: Three times a day (TID) | ORAL | 3 refills | Status: DC

## 2017-01-25 MED ORDER — CLONAZEPAM 1 MG PO TABS: 1.0000 mg | ORAL_TABLET | Freq: Every day | ORAL | 0 refills | Status: DC

## 2017-01-25 MED ORDER — GABAPENTIN 400 MG PO CAPS: 400.0000 mg | ORAL_CAPSULE | Freq: Three times a day (TID) | ORAL | 3 refills | Status: DC

## 2017-01-25 NOTE — Progress Notes (Signed)
Chief Complaint  Patient presents with  . Follow-up   Here for follow up Has not seen Dr Jordan Likes at spine center She saw him in November when she injured her back, but has not heard from the office about a repeat appointment.  Has called the office.  Gave a "phone interview".   Ran out of gabapentin because she increased her dose.  I am giving her 400 mg tabs instead of 300 mg tabs to help She cannot sleep.  Does not tolerate trazodone or remeron .  Did well on klonopin.  I will NOT keep her on this med and discussed with her, again, that sh e risks benzodiazepine dependence again.  Took it for 7 y before. Is anxious and upset and tearful again today. I explained to her again that her back is NOT permanently damaged and is not as bad as she thinks  Patient Active Problem List   Diagnosis Date Noted  . Ganglion cyst of left foot 05/26/2016  . Uncomplicated opioid dependence (HCC) 11/30/2015  . Rubella non-immune status, antepartum 09/10/2015  . History of polysubstance abuse 06/17/2015  . Depression with anxiety 06/17/2015  . History of LEEP (loop electrosurgical excision procedure) of cervix complicating pregnancy 06/17/2015  . Smoker 06/17/2015    Outpatient Encounter Prescriptions as of 01/25/2017  Medication Sig  . DULoxetine (CYMBALTA) 60 MG capsule Take 1 capsule (60 mg total) by mouth daily.  Marland Kitchen gabapentin (NEURONTIN) 400 MG capsule Take 2 capsules (800 mg total) by mouth 3 (three) times daily.  Marland Kitchen ibuprofen (ADVIL,MOTRIN) 800 MG tablet Take 1 tablet (800 mg total) by mouth every 8 (eight) hours as needed.  . methadone (DOLOPHINE) 1 MG/1ML solution Take 50 mg by mouth daily. [facility]  . clonazePAM (KLONOPIN) 1 MG tablet Take 1 tablet (1 mg total) by mouth at bedtime.   No facility-administered encounter medications on file as of 01/25/2017.     Allergies  Allergen Reactions  . Codeine Hives    Review of Systems  Constitutional: Negative for activity  change, appetite change and unexpected weight change.  HENT: Negative for congestion, dental problem, postnasal drip and rhinorrhea.   Eyes: Negative for redness and visual disturbance.  Respiratory: Negative for cough and shortness of breath.   Cardiovascular: Negative for chest pain, palpitations and leg swelling.  Gastrointestinal: Negative for abdominal pain, constipation and diarrhea.  Genitourinary: Negative for difficulty urinating, frequency and menstrual problem.  Musculoskeletal: Positive for back pain. Negative for arthralgias.  Neurological: Negative for dizziness and headaches.  Psychiatric/Behavioral: Positive for agitation, dysphoric mood and sleep disturbance. The patient is nervous/anxious.     BP 136/88 (BP Location: Right Arm, Patient Position: Sitting, Cuff Size: Normal)   Pulse 84   Temp 97.6 F (36.4 C) (Temporal)   Resp 16   Ht 5\' 3"  (1.6 m)   Wt 196 lb 1.9 oz (89 kg)   SpO2 99%   BMI 34.74 kg/m   Physical Exam  Constitutional: She is oriented to person, place, and time. She appears well-developed and well-nourished. She appears distressed.  Does not appear to be in any pain  HENT:  Head: Normocephalic and atraumatic.  Mouth/Throat: Oropharynx is clear and moist.  Cardiovascular: Normal rate, regular rhythm and normal heart sounds.   Pulmonary/Chest: Effort normal and breath sounds normal.  Abdominal: Soft. Bowel sounds are normal.  Musculoskeletal: Normal range of motion.  No tenderness spine  Neurological: She is alert and oriented to person, place, and time. She  displays normal reflexes. Coordination normal.  Psychiatric: Thought content normal. Her mood appears anxious. Her speech is rapid and/or pressured. She is agitated.  Hyper - active, rapid speech, preoccupied with notion of "bad back" She is inattentive.    ASSESSMENT/PLAN:  1. Compression fracture of body of thoracic vertebra (HCC) Pain out of proportion to findings  2. Insomnia,  unspecified type chronic   Patient Instructions  Increase the gabapentin to 400 mg, 2 tabs three times a day If you run out, call us for refill early  I will call pain clinic to get you an appointment  I will give you limited klonopin  [facility] Make sure I have a signed release  See me in one month   Eustace MooreYvonne Sue Beulah Matusek, MD

## 2017-01-25 NOTE — Patient Instructions (Addendum)
Increase the gabapentin to 400 mg, 2 tabs three times a day If you run out, call us for refill early  I will call pain clinic to get you an appointment  I will give you limited klonopin  [facility] Make sure I have a signed release  See me in one month

## 2017-01-26 ENCOUNTER — Other Ambulatory Visit: Payer: Self-pay | Admitting: Family Medicine

## 2017-01-26 DIAGNOSIS — S22000D Wedge compression fracture of unspecified thoracic vertebra, subsequent encounter for fracture with routine healing: Secondary | ICD-10-CM

## 2017-01-26 DIAGNOSIS — M5414 Radiculopathy, thoracic region: Secondary | ICD-10-CM

## 2017-02-16 ENCOUNTER — Telehealth: Payer: Self-pay | Admitting: *Deleted

## 2017-02-16 NOTE — Telephone Encounter (Signed)
Patient called stating when she went to the neurosurgeon and they want her to have a CT done on her neck and back. Patient states all she have is a card with "Alicia Barnett on it". They told the patient she would have to go through Dr Delton SeeNelson for the CT. Patient states she does not have phone service where she lives to please leave a message if she does not answer. [phone]

## 2017-02-16 NOTE — Telephone Encounter (Signed)
Please call Alicia Barnett at [facility] to see if I need to order the CT - the records we rec'd look like it was done by her doctor.

## 2017-02-16 NOTE — Telephone Encounter (Signed)
I called and left a message with Dr Ethelene BrownsPoole's CMA regarding this.

## 2017-02-17 NOTE — Telephone Encounter (Signed)
Alicia Barnett called stating they are ordering a mri of her neck and they are waiting on authorization from medicaid. Per Alicia Barnett patient was very aware of the process and she was given her card so if she had any questions.

## 2017-02-18 NOTE — Telephone Encounter (Signed)
FYI

## 2017-03-01 ENCOUNTER — Ambulatory Visit (INDEPENDENT_AMBULATORY_CARE_PROVIDER_SITE_OTHER): Payer: Medicaid Other | Admitting: Family Medicine

## 2017-03-01 ENCOUNTER — Encounter: Payer: Self-pay | Admitting: Family Medicine

## 2017-03-01 VITALS — BP 114/62 | HR 88 | Temp 97.9°F | Resp 18 | Ht 63.0 in | Wt 197.1 lb

## 2017-03-01 DIAGNOSIS — M4854XA Collapsed vertebra, not elsewhere classified, thoracic region, initial encounter for fracture: Secondary | ICD-10-CM | POA: Diagnosis not present

## 2017-03-01 DIAGNOSIS — G8929 Other chronic pain: Secondary | ICD-10-CM

## 2017-03-01 DIAGNOSIS — Z23 Encounter for immunization: Secondary | ICD-10-CM

## 2017-03-01 DIAGNOSIS — M546 Pain in thoracic spine: Secondary | ICD-10-CM

## 2017-03-01 DIAGNOSIS — S22000A Wedge compression fracture of unspecified thoracic vertebra, initial encounter for closed fracture: Secondary | ICD-10-CM

## 2017-03-01 MED ORDER — CLONAZEPAM 1 MG PO TABS: 1.0000 mg | ORAL_TABLET | Freq: Every day | ORAL | 0 refills | Status: DC

## 2017-03-01 NOTE — Patient Instructions (Signed)
Take a half clonazepam at night as needed No other change in medicines Call Morrie Sheldon about the neck films  See me in a couple of months Continue to try to quit smoking

## 2017-03-01 NOTE — Progress Notes (Signed)
Chief Complaint  Patient presents with  . Follow-up    1 month  is doing better Takes clonazepam .5 mg at night as needed.  20 polls lasted her over 40 d. Is going to [facility] in a couple of weeks and is told that they will have to provide any future benzodiazepines. Methadone is tapered to 40 a day She saw the spine specialist and MRI is pending approval insurance She has manageable pain on her current medicines and gabapentin.   Patient Active Problem List   Diagnosis Date Noted  . Ganglion cyst of left foot 05/26/2016  . Uncomplicated opioid dependence (HCC) 11/30/2015  . Rubella non-immune status, antepartum 09/10/2015  . History of polysubstance abuse 06/17/2015  . Depression with anxiety 06/17/2015  . History of LEEP (loop electrosurgical excision procedure) of cervix complicating pregnancy 06/17/2015  . Smoker 06/17/2015    Outpatient Encounter Prescriptions as of 03/01/2017  Medication Sig  . clonazePAM (KLONOPIN) 1 MG tablet Take 1 tablet (1 mg total) by mouth at bedtime.  . DULoxetine (CYMBALTA) 60 MG capsule Take 1 capsule (60 mg total) by mouth daily.  Marland Kitchen gabapentin (NEURONTIN) 400 MG capsule Take 2 capsules (800 mg total) by mouth 3 (three) times daily.  Marland Kitchen ibuprofen (ADVIL,MOTRIN) 800 MG tablet Take 1 tablet (800 mg total) by mouth every 8 (eight) hours as needed.  . methadone (DOLOPHINE) 1 MG/1ML solution Take 40 mg by mouth daily. [facility]   No facility-administered encounter medications on file as of 03/01/2017.     Allergies  Allergen Reactions  . Codeine Hives    Review of Systems  Constitutional: Negative for activity change, appetite change and unexpected weight change.  HENT: Negative for congestion, dental problem, postnasal drip and rhinorrhea.   Eyes: Negative for redness and visual disturbance.  Respiratory: Negative for cough and shortness of breath.   Cardiovascular: Negative for chest pain, palpitations and  leg swelling.  Gastrointestinal: Negative for abdominal pain, constipation and diarrhea.  Genitourinary: Negative for difficulty urinating, frequency and menstrual problem.  Musculoskeletal: Positive for back pain and neck pain. Negative for arthralgias.       Chronic back, occas neck  Neurological: Negative for dizziness and headaches.  Psychiatric/Behavioral: Positive for dysphoric mood. Negative for agitation and sleep disturbance. The patient is nervous/anxious.        Improved    BP 114/62 (BP Location: Right Arm, Patient Position: Sitting, Cuff Size: Normal)   Pulse 88   Temp 97.9 F (36.6 C) (Temporal)   Resp 18   Ht 5\' 3"  (1.6 m)   Wt 197 lb 1.3 oz (89.4 kg)   SpO2 98%   BMI 34.91 kg/m   Physical Exam  Constitutional: She is oriented to person, place, and time. She appears well-developed and well-nourished. She appears distressed.  Does not appear to be in any pain  HENT:  Head: Normocephalic and atraumatic.  Mouth/Throat: Oropharynx is clear and moist.  Cardiovascular: Normal rate, regular rhythm and normal heart sounds.   Pulmonary/Chest: Effort normal and breath sounds normal.  Abdominal: Soft. Bowel sounds are normal.  Musculoskeletal: Normal range of motion.  No tenderness spine  Neurological: She is alert and oriented to person, place, and time. She displays normal reflexes. Coordination normal.  Psychiatric: Her speech is normal. Thought content normal. Cognition and memory are normal.  Calm.    ASSESSMENT/PLAN:  1. Compression fracture of body of thoracic vertebra (HCC) healed  2. Chronic bilateral thoracic back pain  Also new complaint of neck pain  3. Need for influenza vaccination Deferred per patient request   Patient Instructions  Take a half clonazepam at night as needed No other change in medicines Call Morrie Sheldon about the neck films  See me in a couple of months Continue to try to quit smoking   Eustace Moore, MD

## 2017-04-12 ENCOUNTER — Telehealth: Payer: Self-pay | Admitting: Family Medicine

## 2017-04-12 NOTE — Telephone Encounter (Signed)
Spoke to Alicia Barnett, states she has lost a weeks worth of gabapentin.Marland Kitchenand will be out tomorrow

## 2017-04-12 NOTE — Telephone Encounter (Signed)
Patient left message on nurse line stating her call back number was her daughter's phone. Is requesting call.  Callback# [phone]

## 2017-04-18 NOTE — Telephone Encounter (Signed)
noted 

## 2017-04-23 ENCOUNTER — Other Ambulatory Visit: Payer: Self-pay | Admitting: Family Medicine

## 2017-05-02 ENCOUNTER — Encounter: Payer: Self-pay | Admitting: Family Medicine

## 2017-05-02 ENCOUNTER — Ambulatory Visit (INDEPENDENT_AMBULATORY_CARE_PROVIDER_SITE_OTHER): Payer: Medicaid Other | Admitting: Family Medicine

## 2017-05-02 VITALS — BP 136/92 | HR 72 | Temp 97.9°F | Resp 16 | Ht 63.0 in | Wt 197.1 lb

## 2017-05-02 DIAGNOSIS — F418 Other specified anxiety disorders: Secondary | ICD-10-CM

## 2017-05-02 DIAGNOSIS — F17200 Nicotine dependence, unspecified, uncomplicated: Secondary | ICD-10-CM

## 2017-05-02 DIAGNOSIS — Z23 Encounter for immunization: Secondary | ICD-10-CM

## 2017-05-02 MED ORDER — GABAPENTIN 400 MG PO CAPS: 800.0000 mg | ORAL_CAPSULE | Freq: Three times a day (TID) | ORAL | 3 refills | Status: DC

## 2017-05-02 MED ORDER — NICOTINE 14 MG/24HR TD PT24: 14.0000 mg | MEDICATED_PATCH | Freq: Every day | TRANSDERMAL | 2 refills | Status: DC

## 2017-05-02 MED ORDER — DULOXETINE HCL 60 MG PO CPEP: 60.0000 mg | ORAL_CAPSULE | Freq: Every day | ORAL | 3 refills | Status: AC

## 2017-05-02 NOTE — Patient Instructions (Addendum)
Medicines are refilled No change in therapy Walk every day that you are able Try the nicotine patches Let me know if you need refills

## 2017-05-02 NOTE — Progress Notes (Signed)
Chief Complaint  Patient presents with  . Follow-up  She is here for routine follow-up. Her neck and back pain have improved.  They are at a tolerable level.  She never got the MRI of her cervical spine. She continues taking gabapentin daily.  She is avoiding mood altering drugs. She goes to the methadone clinic on a regular schedule.  They are reducing her methadone over time. She is up-to-date with her health care.  She wants a flu shot today.  She requests, again, a refill of clonazepam.  I explained to her on more than one occasion that this is not a drug I will give her on an ongoing basis.  With her substance abuse problem it is advisable to avoid mood altering drugs.  We reviewed again the importance of smoking cessation.  She states that her family is encouraging her to quit.  I offer her a prescription for nicotine patches.  These have been successful for her in the past.   Patient Active Problem List   Diagnosis Date Noted  . Ganglion cyst of left foot 05/26/2016  . Uncomplicated opioid dependence (HCC) 11/30/2015  . Rubella non-immune status, antepartum 09/10/2015  . History of polysubstance abuse 06/17/2015  . Depression with anxiety 06/17/2015  . History of LEEP (loop electrosurgical excision procedure) of cervix complicating pregnancy 06/17/2015  . Smoker 06/17/2015    Outpatient Encounter Prescriptions as of 05/02/2017  Medication Sig  . DULoxetine (CYMBALTA) 60 MG capsule Take 1 capsule (60 mg total) by mouth daily.  Marland Kitchen. gabapentin (NEURONTIN) 400 MG capsule Take 2 capsules (800 mg total) by mouth 3 (three) times daily.  Marland Kitchen. ibuprofen (ADVIL,MOTRIN) 800 MG tablet Take 1 tablet (800 mg total) by mouth every 8 (eight) hours as needed.  . methadone (DOLOPHINE) 1 MG/1ML solution Take 70 mg by mouth daily. [facility]  . nicotine (NICODERM CQ - DOSED IN MG/24 HOURS) 14 mg/24hr patch Place 1 patch (14 mg total) onto the skin daily.   No  facility-administered encounter medications on file as of 05/02/2017.     Allergies  Allergen Reactions  . Codeine Hives    Review of Systems  Constitutional: Negative for activity change, appetite change and unexpected weight change.  HENT: Negative for congestion, dental problem, postnasal drip and rhinorrhea.   Eyes: Negative for redness and visual disturbance.  Respiratory: Negative for cough and shortness of breath.   Cardiovascular: Negative for chest pain, palpitations and leg swelling.  Gastrointestinal: Negative for abdominal pain, constipation and diarrhea.  Genitourinary: Negative for difficulty urinating, frequency and menstrual problem.  Musculoskeletal: Positive for back pain and neck pain. Negative for arthralgias.       Chronic pain, manageable  Neurological: Negative for dizziness and headaches.  Psychiatric/Behavioral: Positive for dysphoric mood. Negative for agitation and sleep disturbance. The patient is not nervous/anxious.        Improved on medication, stable    BP (!) 136/92 (BP Location: Left Arm, Patient Position: Sitting, Cuff Size: Normal)   Pulse 72   Temp 97.9 F (36.6 C) (Temporal)   Resp 16   Ht 5\' 3"  (1.6 m)   Wt 197 lb 1.9 oz (89.4 kg)   SpO2 98%   BMI 34.92 kg/m   Physical Exam  Constitutional: She is oriented to person, place, and time. She appears well-developed and well-nourished. She appears distressed.  Does not appear to be in any pain  HENT:  Head: Normocephalic and atraumatic.  Mouth/Throat: Oropharynx is clear and  moist.  Cardiovascular: Normal rate, regular rhythm and normal heart sounds.   Pulmonary/Chest: Effort normal and breath sounds normal.  Abdominal: Soft. Bowel sounds are normal.  Musculoskeletal: Normal range of motion.  No tenderness spine neck through sacrum  Neurological: She is alert and oriented to person, place, and time. She displays normal reflexes. Coordination normal.  Psychiatric: Her speech is normal.  Thought content normal. Cognition and memory are normal.  Calm.    ASSESSMENT/PLAN:  1. Need for influenza vaccination - Flu Vaccine QUAD 36+ mos IM  2. Depression with anxiety  3. Smoker    Patient Instructions  Medicines are refilled No change in therapy Walk every day that you are able Try the nicotine patches Let me know if you need refills   Eustace Moore, MD

## 2017-05-19 ENCOUNTER — Ambulatory Visit: Payer: Medicaid Other | Admitting: Family Medicine

## 2017-07-26 ENCOUNTER — Telehealth: Payer: Self-pay | Admitting: Family Medicine

## 2017-07-26 NOTE — Telephone Encounter (Signed)
Patient left voicemail that she called in last thur or fri to make an appt, she is requesting an appt with Dr.Nelson. I tried to call her back but the phone # in her chart is not a working # and she did not leave a phone # on her voice message.

## 2017-08-01 ENCOUNTER — Other Ambulatory Visit: Payer: Self-pay | Admitting: Family Medicine

## 2017-08-08 ENCOUNTER — Ambulatory Visit: Payer: Medicaid Other | Admitting: Family Medicine

## 2017-08-09 ENCOUNTER — Telehealth: Payer: Self-pay | Admitting: Family Medicine

## 2017-08-09 ENCOUNTER — Encounter: Payer: Self-pay | Admitting: Family Medicine

## 2017-08-09 ENCOUNTER — Ambulatory Visit (INDEPENDENT_AMBULATORY_CARE_PROVIDER_SITE_OTHER): Payer: Medicaid Other | Admitting: Family Medicine

## 2017-08-09 ENCOUNTER — Other Ambulatory Visit: Payer: Self-pay

## 2017-08-09 ENCOUNTER — Other Ambulatory Visit: Payer: Self-pay | Admitting: Family Medicine

## 2017-08-09 VITALS — BP 120/86 | HR 84 | Temp 98.6°F | Resp 16 | Ht 63.0 in | Wt 206.1 lb

## 2017-08-09 DIAGNOSIS — F418 Other specified anxiety disorders: Secondary | ICD-10-CM | POA: Diagnosis not present

## 2017-08-09 DIAGNOSIS — F1120 Opioid dependence, uncomplicated: Secondary | ICD-10-CM

## 2017-08-09 MED ORDER — GABAPENTIN 400 MG PO CAPS: ORAL_CAPSULE | ORAL | 2 refills | Status: DC

## 2017-08-09 NOTE — Telephone Encounter (Signed)
2 refills were sent to her pharmacy

## 2017-08-09 NOTE — Telephone Encounter (Signed)
Patient left message on nurse line asking if she could get several refills on gabapentin until she finds new PCP. She asks to let her know either way as soon as possible.

## 2017-08-09 NOTE — Telephone Encounter (Signed)
Left message for Alicia Barnett.

## 2017-08-09 NOTE — Progress Notes (Signed)
Chief Complaint  Patient presents with  . Anxiety   Patient made an appointment with me today to "discuss medication". She is actively under the care of a methadone clinic and has to go there on a regular basis for medication and counseling. 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  Patient specifically told me that her counselor at the addiction clinic asked her to see her primary care doctor to get a prescription for benzodiazepines to help her get off of the methadone.  She states that she has weaned herself down to the point where she can stop the methadone, but that she needs help with anxiety "for a while".  She has a history of benzodiazepine dependence and previously took it for 7 years.  She is asked me for benzodiazepines at each visit.  I have explained to her number of times that I am not comfortable prescribing this for her, and further do not feel like it is a good drug for her given her addiction history.  With her permission I called her counselor Dallas Breeding ([phone]).  He told me that he has not recommended she go off of methadone.  He has not recommended she take benzodiazepines.  He states she is not ready to wean off the methadone at this time.  I believe this is another attempt at drug-seeking behavior.  It is more convenient for her not to go to a methadone clinic every day therefore she has fabricated the story about needing benzodiazepines so she can stop.  This has not been medically recommended for her.  Patient Active Problem List   Diagnosis Date Noted  . Ganglion cyst of left foot 05/26/2016  . Uncomplicated opioid dependence (HCC) 11/30/2015  . Rubella non-immune status, antepartum 09/10/2015  . History of polysubstance abuse 06/17/2015  . Depression with anxiety 06/17/2015  . History of LEEP (loop electrosurgical excision procedure) of cervix  complicating pregnancy 06/17/2015  . Smoker 06/17/2015    Outpatient Encounter Medications as of 08/09/2017  Medication Sig  . DULoxetine (CYMBALTA) 60 MG capsule Take 1 capsule (60 mg total) by mouth daily.  Marland Kitchen gabapentin (NEURONTIN) 400 MG capsule TAKE 2 CAPSULES BY MOUTH 3 TIMES DAILY.  . methadone (DOLOPHINE) 1 MG/1ML solution Take 40 mg by mouth daily. [facility]  . ibuprofen (ADVIL,MOTRIN) 800 MG tablet Take 1 tablet (800 mg total) by mouth every 8 (eight) hours as needed. (Patient not taking: Reported on 08/09/2017)   No facility-administered encounter medications on file as of 08/09/2017.     Allergies  Allergen Reactions  . Codeine Hives    Review of Systems Not done   BP 120/86 (BP Location: Left Arm, Patient Position: Sitting, Cuff Size: Normal)   Pulse 84   Temp 98.6 F (37 C) (Temporal)   Resp 16   Ht 5\' 3"  (1.6 m)   Wt 206 lb 1.3 oz (93.5 kg)   SpO2 99%   BMI 36.51 kg/m   Physical Exam Not done ASSESSMENT/PLAN:  1. Depression with anxiety - Ambulatory referral to Psychiatry  2. Uncomplicated opioid dependence (HCC) - Ambulatory referral to Psychiatry  Greater than 50% of this visit was spent in counseling and coordinating care.  Total face to face time:   15 minutes was spent with the patient in discussion.  I explained to her that she had had multiple no-shows and had not  followed my advice on previous interactions.  I explained to her that I had difficulty with her repeated dishonesty, and that we did not have the makings of a good physician and patient relationship.  For this reason I am discharging her from my practice.  Patient Instructions  Call your GYN for PAP smear  Continue under care of the addiction clinic  I am not going give you any controlled substances  I am referring you to psychiatry  You need to obtain medical care elsewhere for future needs.     Eustace MooreYvonne Sue Giles Currie, MD

## 2017-08-09 NOTE — Patient Instructions (Addendum)
Call your GYN for PAP smear  Continue under care of the addiction clinic  I am not going give you any controlled substances  I am referring you to psychiatry  You need to obtain medical care elsewhere for future needs.

## 2017-08-12 ENCOUNTER — Emergency Department (HOSPITAL_COMMUNITY)
Admission: EM | Admit: 2017-08-12 | Discharge: 2017-08-12 | Discharge status: Against Medical Advice | Payer: Medicaid Other

## 2017-08-12 NOTE — ED Notes (Signed)
Called Pt no answer X2 

## 2017-08-12 NOTE — ED Notes (Signed)
Called x2. No answe

## 2017-10-28 ENCOUNTER — Other Ambulatory Visit: Payer: Self-pay | Admitting: Family Medicine

## 2017-11-24 ENCOUNTER — Other Ambulatory Visit: Payer: Self-pay | Admitting: Family Medicine

## 2017-11-25 ENCOUNTER — Other Ambulatory Visit: Payer: Self-pay | Admitting: Family Medicine

## 2017-12-08 ENCOUNTER — Emergency Department (HOSPITAL_COMMUNITY)
Admission: EM | Admit: 2017-12-08 | Discharge: 2017-12-08 | Disposition: A | Discharge status: Home / Self Care | Payer: Medicaid Other | Attending: Emergency Medicine | Admitting: Emergency Medicine

## 2017-12-08 ENCOUNTER — Encounter (HOSPITAL_COMMUNITY): Payer: Self-pay

## 2017-12-08 ENCOUNTER — Other Ambulatory Visit: Payer: Self-pay

## 2017-12-08 DIAGNOSIS — Z5321 Procedure and treatment not carried out due to patient leaving prior to being seen by health care provider: Secondary | ICD-10-CM | POA: Diagnosis not present

## 2017-12-08 DIAGNOSIS — M549 Dorsalgia, unspecified: Secondary | ICD-10-CM | POA: Diagnosis present

## 2017-12-08 LAB — URINALYSIS, ROUTINE W REFLEX MICROSCOPIC
BILIRUBIN URINE: NEGATIVE
Glucose, UA: NEGATIVE mg/dL
KETONES UR: NEGATIVE mg/dL
LEUKOCYTES UA: NEGATIVE
Nitrite: NEGATIVE
Protein, ur: NEGATIVE mg/dL
Specific Gravity, Urine: 1.005 (ref 1.005–1.030)
pH: 6 (ref 5.0–8.0)

## 2017-12-08 LAB — PREGNANCY, URINE: Preg Test, Ur: NEGATIVE

## 2017-12-08 MED ORDER — METHOCARBAMOL 500 MG PO TABS
1000.0000 mg | ORAL_TABLET | Freq: Once | ORAL | Status: AC
  Administered 2017-12-08: 1000 mg via ORAL
  Filled 2017-12-08: qty 2

## 2017-12-08 NOTE — ED Provider Notes (Cosign Needed)
Patient placed in Quick Look pathway, seen and evaluated   Chief Complaint: back pain  HPI:   Pt presents for increasing back pain over past two weeks, reports hx of the same, reports she was supposed to have surgery but never did. Denies weakness, numbness, loss of bowel/bladder function or saddle anesthesia. No abdominal pain, pt reports some bladder pressure and also reports some abnormal vaginal bleeding this afternoon.   ROS: + back pain, abnormal menstrual cycle, - fevers, abd pain, saddle anesthesia  Physical Exam:   Gen: No distress  Neuro: Awake and Alert  Skin: Warm    Focused Exam: Tenderness over midline lumbar spine, bilateral lower extremities with 5/5 strength and sensation intact, abdomen NTTP    Initiation of care has begun. The patient has been counseled on the process, plan, and necessity for staying for the completion/evaluation, and the remainder of the medical screening examination    Dartha LodgeFord, Kelsey N, Cordelia Poche-C 12/08/17 1805

## 2017-12-08 NOTE — ED Notes (Signed)
Patient called x 3 in all areas of lobby and outside lobby with no response.

## 2017-12-08 NOTE — ED Triage Notes (Signed)
Pt states that she is on Methadone, Cymbalta and gabapentin. Reports that she has had increasing back pain for the last few weeks. States that today she had bleeding after laying down, reports that she ended her period a few days prior. She states that she "sneezed and locked up" pointing to her back. C/O being stiff when sleeping in bed with it waking her up out of sleep.

## 2018-06-11 ENCOUNTER — Emergency Department
Admission: EM | Admit: 2018-06-11 | Discharge: 2018-06-11 | Disposition: A | Discharge status: Home / Self Care | Payer: Medicaid Other | Attending: Emergency Medicine | Admitting: Emergency Medicine

## 2018-06-11 ENCOUNTER — Emergency Department: Payer: Medicaid Other

## 2018-06-11 ENCOUNTER — Other Ambulatory Visit: Payer: Self-pay

## 2018-06-11 DIAGNOSIS — Z79899 Other long term (current) drug therapy: Secondary | ICD-10-CM | POA: Insufficient documentation

## 2018-06-11 DIAGNOSIS — F17210 Nicotine dependence, cigarettes, uncomplicated: Secondary | ICD-10-CM | POA: Diagnosis not present

## 2018-06-11 DIAGNOSIS — M545 Low back pain, unspecified: Secondary | ICD-10-CM

## 2018-06-11 DIAGNOSIS — G8929 Other chronic pain: Secondary | ICD-10-CM | POA: Diagnosis not present

## 2018-06-11 DIAGNOSIS — K5900 Constipation, unspecified: Secondary | ICD-10-CM

## 2018-06-11 MED ORDER — SENNOSIDES-DOCUSATE SODIUM 8.6-50 MG PO TABS: 1.0000 | ORAL_TABLET | Freq: Every day | ORAL | 0 refills | Status: DC

## 2018-06-11 MED ORDER — MAGNESIUM CITRATE PO SOLN: 1.0000 | Freq: Once | ORAL | 0 refills | Status: AC

## 2018-06-11 MED ORDER — METHOCARBAMOL 500 MG PO TABS: 500.0000 mg | ORAL_TABLET | Freq: Four times a day (QID) | ORAL | 0 refills | Status: DC
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
## 2018-06-11 NOTE — ED Notes (Signed)
Pt upset at discharge due to no med to help her sleep. Pt left without signing or giving pain scale.

## 2018-06-11 NOTE — ED Notes (Addendum)
See triage note. Pt c/o issues with sleeping. States when she is on her back she gets numbness in her legs. Currently curled up in a ball on her side. Current L leg tingling but not in R. Foot pulses 2+. Appropriate warmth/color.
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
## 2018-06-27 ENCOUNTER — Emergency Department (HOSPITAL_COMMUNITY)
Admission: EM | Admit: 2018-06-27 | Discharge: 2018-06-27 | Disposition: A | Discharge status: Home / Self Care | Payer: Medicaid Other | Attending: Emergency Medicine | Admitting: Emergency Medicine

## 2018-06-27 ENCOUNTER — Other Ambulatory Visit: Payer: Self-pay

## 2018-06-27 ENCOUNTER — Emergency Department (HOSPITAL_COMMUNITY): Payer: Medicaid Other

## 2018-06-27 ENCOUNTER — Encounter (HOSPITAL_COMMUNITY): Payer: Self-pay

## 2018-06-27 DIAGNOSIS — Y998 Other external cause status: Secondary | ICD-10-CM | POA: Diagnosis not present

## 2018-06-27 DIAGNOSIS — S93602A Unspecified sprain of left foot, initial encounter: Secondary | ICD-10-CM | POA: Insufficient documentation

## 2018-06-27 DIAGNOSIS — S99922A Unspecified injury of left foot, initial encounter: Secondary | ICD-10-CM | POA: Diagnosis present

## 2018-06-27 DIAGNOSIS — Y929 Unspecified place or not applicable: Secondary | ICD-10-CM | POA: Diagnosis not present

## 2018-06-27 DIAGNOSIS — Y939 Activity, unspecified: Secondary | ICD-10-CM | POA: Insufficient documentation

## 2018-06-27 DIAGNOSIS — S93402A Sprain of unspecified ligament of left ankle, initial encounter: Secondary | ICD-10-CM | POA: Insufficient documentation

## 2018-06-27 DIAGNOSIS — Z79899 Other long term (current) drug therapy: Secondary | ICD-10-CM | POA: Diagnosis not present

## 2018-06-27 DIAGNOSIS — Y33XXXA Other specified events, undetermined intent, initial encounter: Secondary | ICD-10-CM | POA: Diagnosis not present

## 2018-06-27 DIAGNOSIS — F17210 Nicotine dependence, cigarettes, uncomplicated: Secondary | ICD-10-CM | POA: Diagnosis not present

## 2018-06-27 DIAGNOSIS — M5432 Sciatica, left side: Secondary | ICD-10-CM | POA: Insufficient documentation

## 2018-06-27 HISTORY — DX: Sciatica, unspecified side: M54.30

## 2018-06-27 MED ORDER — PREDNISONE 20 MG PO TABS: ORAL_TABLET | ORAL | 0 refills | Status: DC

## 2018-06-27 MED ORDER — TRAMADOL HCL 50 MG PO TABS
50.0000 mg | ORAL_TABLET | Freq: Once | ORAL | Status: AC
  Administered 2018-06-27: 50 mg via ORAL
  Filled 2018-06-27: qty 1

## 2018-06-27 MED ORDER — PREDNISONE 20 MG PO TABS
60.0000 mg | ORAL_TABLET | Freq: Once | ORAL | Status: AC
  Administered 2018-06-27: 60 mg via ORAL
  Filled 2018-06-27: qty 3

## 2018-06-27 MED ORDER — TRAMADOL HCL 50 MG PO TABS: 50.0000 mg | ORAL_TABLET | Freq: Four times a day (QID) | ORAL | 0 refills | Status: DC | PRN

## 2018-06-27 NOTE — ED Provider Notes (Signed)
[facility] Provider Note   CSN: [id number] Arrival date & time: 06/27/18  1240     History   Chief Complaint Chief Complaint  Patient presents with  . Foot Pain  . Ankle Pain    HPI Alicia Corneaicole Finkel is a 42 y.o. female.  Pt s/p fall 2 days ago, states tripped due to having sciatica in left leg. Pt twisted left foot and ankle. C/o left foot/ankle pain, constant, dull, moderate, worse w walking. No numbness/weakness. Skin intact. Denies other pain or injury related to fall. No head injury or headache. No neck/back injury. Pt notes long hx sciatica/ddd for many years - states has flared up recently with pain radiating down posterior/lat left leg. No leg swelling. No fevers.   The history is provided by the patient.  Foot Pain  Pertinent negatives include no chest pain, no abdominal pain, no headaches and no shortness of breath.  Ankle Pain   Pertinent negatives include no numbness.    Past Medical History:  Diagnosis Date  . Anxiety   . Depression   . Polysubstance abuse (HCC)   . Sciatica     Patient Active Problem List   Diagnosis Date Noted  . Ganglion cyst of left foot 05/26/2016  . Uncomplicated opioid dependence (HCC) 11/30/2015  . Rubella non-immune status, antepartum 09/10/2015  . History of polysubstance abuse 06/17/2015  . Depression with anxiety 06/17/2015  . History of LEEP (loop electrosurgical excision procedure) of cervix complicating pregnancy 06/17/2015  . Smoker 06/17/2015    Past Surgical History:  Procedure Laterality Date  . LAPAROSCOPIC BILATERAL SALPINGECTOMY Bilateral 02/13/2016   Procedure: LAPAROSCOPIC BILATERAL SALPINGECTOMY;  Surgeon: Tilda BurrowJohn V Ferguson, MD;  Location: [facility];  Service: Gynecology;  Laterality: Bilateral;  . LEEP    . NO PAST SURGERIES    . TUBAL LIGATION       OB History    Gravida  6   Para  3   Term  3   Preterm      AB  2   Living  3     SAB      TAB  2   Ectopic      Multiple      Live Births  3            Home Medications    Prior to Admission medications   Medication Sig Start Date End Date Taking? Authorizing Provider  DULoxetine (CYMBALTA) 60 MG capsule Take 1 capsule (60 mg total) by mouth daily. 05/02/17   Eustace MooreNelson, Yvonne Sue, MD  gabapentin (NEURONTIN) 400 MG capsule TAKE 2 CAPSULES BY MOUTH 3 TIMES DAILY. 08/09/17   Eustace MooreNelson, Yvonne Sue, MD  ibuprofen (ADVIL,MOTRIN) 800 MG tablet Take 1 tablet (800 mg total) by mouth every 8 (eight) hours as needed. Patient not taking: Reported on 08/09/2017 05/26/16   Eustace MooreNelson, Yvonne Sue, MD  methadone (DOLOPHINE) 1 MG/1ML solution Take 40 mg by mouth daily. [facility]    [provider]  methocarbamol (ROBAXIN) 500 MG tablet Take 1 tablet (500 mg total) by mouth 4 (four) times daily. 06/11/18   Cuthriell, Delorise RoyalsJonathan D, PA-C  senna-docusate (SENOKOT-S) 8.6-50 MG tablet Take 1 tablet by mouth daily. 06/11/18   Cuthriell, Delorise RoyalsJonathan D, PA-C    Family History Family History  Problem Relation Age of Onset  . Heart failure Mother   . Hypertension Mother   . Pulmonary embolism Mother [id number]       PE after surgery  .  Cancer Maternal Grandmother        lung  . Diabetes Maternal Grandfather   . Cancer Maternal Grandfather        liver  . Asthma Daughter   . Cancer Other     Social History Social History   Tobacco Use  . Smoking status: Current Every Day Smoker    Packs/day: 0.50    Years: 23.00    Pack years: 11.50    Types: Cigarettes  . Smokeless tobacco: Never Used  Substance Use Topics  . Alcohol use: No  . Drug use: Yes    Types: Marijuana    Comment: "once in a while"     Allergies   Codeine   Review of Systems Review of Systems  Constitutional: Negative for fever.  HENT: Negative for sore throat.   Eyes: Negative for redness.  Respiratory: Negative for shortness of breath.   Cardiovascular: Negative for chest pain.  Gastrointestinal: Negative for abdominal pain  and vomiting.  Genitourinary: Negative for flank pain.  Musculoskeletal: Negative for neck pain.  Skin: Negative for wound.  Neurological: Negative for weakness, numbness and headaches.  Hematological: Does not bruise/bleed easily.  Psychiatric/Behavioral: Negative for confusion.     Physical Exam Updated Vital Signs BP 116/80 (BP Location: Right Arm)   Pulse 70   Temp 98 F (36.7 C) (Oral)   Resp 16   LMP 06/26/2018 (Exact Date)   SpO2 100%   Physical Exam Vitals signs and nursing note reviewed.  Constitutional:      Appearance: Normal appearance. She is well-developed.  HENT:     Head: Atraumatic.  Eyes:     General: No scleral icterus.    Conjunctiva/sclera: Conjunctivae normal.  Neck:     Musculoskeletal: Normal range of motion and neck supple.     Trachea: No tracheal deviation.  Cardiovascular:     Rate and Rhythm: Normal rate.     Pulses: Normal pulses.  Pulmonary:     Effort: Pulmonary effort is normal. No respiratory distress.  Abdominal:     General: There is no distension.  Musculoskeletal:     Comments: Tenderness left lat malleolus, and left mid foot. Skin intact. Dp/pt 2+. Ankle grossly stable. No other focal bony tenderness noted. T/L/S spine non tender, aligned, no step off.   Skin:    General: Skin is warm and dry.     Findings: No rash.  Neurological:     Mental Status: She is alert.     Comments: Speech clear/fluent. Motor/sens grossly intact bil. Steady gait.   Psychiatric:        Mood and Affect: Mood normal.      ED Treatments / Results  Labs (all labs ordered are listed, but only abnormal results are displayed) Labs Reviewed - No data to display  EKG None  Radiology Dg Ankle Complete Left  Result Date: 06/27/2018 CLINICAL DATA:  Sprained ankle 2-3 days ago. EXAM: LEFT ANKLE COMPLETE - 3+ VIEW COMPARISON:  None. FINDINGS: The ankle mortise is maintained. No acute ankle fracture. No osteochondral lesion. No definite joint effusion.  IMPRESSION: No acute ankle fracture. Electronically Signed   By: Rudie Meyer M.D.   On: 06/27/2018 13:44   Dg Foot Complete Left  Result Date: 06/27/2018 CLINICAL DATA:  Injured foot and ankle a few days ago. Persistent pain. EXAM: LEFT FOOT - COMPLETE 3+ VIEW COMPARISON:  None. FINDINGS: This is the joint spaces are maintained. No acute fractures identified. Mild hallux valgus deformity noted. Mild  degenerative changes at the calcaneocuboid joint with lateral spurring. Fairly significant pes cavus noted. Os trigonum noted. IMPRESSION: No acute fracture. Electronically Signed   By: Rudie MeyerP.  Gallerani M.D.   On: 06/27/2018 13:43    Procedures Procedures (including critical care time)  Medications Ordered in ED Medications - No data to display   Initial Impression / Assessment and Plan / ED Course  I have reviewed the triage vital signs and the nursing notes.  Pertinent labs & imaging results that were available during my care of the patient were reviewed by me and considered in my medical decision making (see chart for details).  Imaging ordered.   Icepack.   Pt indicates has ride, does not have to drive. Ultram po.   For sciatica, will try course pred, ultram for pain.   xrays reviewed - no acute fracture.   Patient currently appears stable for d/c.  rec pcp f/u.     Final Clinical Impressions(s) / ED Diagnoses   Final diagnoses:  None    ED Discharge Orders    None       Cathren LaineSteinl, Labaron Digirolamo, MD 06/27/18 [phone]

## 2018-06-27 NOTE — ED Notes (Signed)
Pt verbalized understanding of d/c instructions and has no further questions, VSS, NAD.  

## 2018-06-27 NOTE — Discharge Instructions (Signed)
It was our pleasure to provide your ER care today - we hope that you feel better.  Take prednisone as prescribed. Take acetaminophen as need for pain. You may also take ultram as need for pain - no driving for the next 4 hours, or when taking ultrma.   Elevate foot, ice/coldpack to sore area.   Follow up with primary care doctor in the coming week.

## 2018-06-27 NOTE — ED Triage Notes (Signed)
Pt endorses left ankle/foot pain x 2 days after having a fall which was caused due to left lower back pain/sciatica which the pt has a hx of. VSS. Ambulatory.

## 2018-07-27 ENCOUNTER — Ambulatory Visit (INDEPENDENT_AMBULATORY_CARE_PROVIDER_SITE_OTHER): Payer: Medicaid Other | Admitting: Obstetrics and Gynecology

## 2018-07-27 ENCOUNTER — Encounter: Payer: Self-pay | Admitting: Obstetrics and Gynecology

## 2018-07-27 ENCOUNTER — Other Ambulatory Visit: Payer: Self-pay | Admitting: Obstetrics and Gynecology

## 2018-07-27 VITALS — BP 121/76 | HR 90 | Ht 63.5 in | Wt 222.6 lb

## 2018-07-27 DIAGNOSIS — N870 Mild cervical dysplasia: Secondary | ICD-10-CM | POA: Diagnosis not present

## 2018-07-27 DIAGNOSIS — R87610 Atypical squamous cells of undetermined significance on cytologic smear of cervix (ASC-US): Secondary | ICD-10-CM

## 2018-07-27 DIAGNOSIS — Z3202 Encounter for pregnancy test, result negative: Secondary | ICD-10-CM | POA: Diagnosis not present

## 2018-07-27 LAB — POCT URINE PREGNANCY: Preg Test, Ur: NEGATIVE

## 2018-07-27 NOTE — Progress Notes (Addendum)
Alicia Barnett 43 y.o. [id number] here for colposcopy for high-grade squamous intraepithelial neoplasia  (HGSIL-encompassing moderate and severe dysplasia) pap smear on 06/16/2018 through [facility]. Pt had LEEP  In past, 10 yrs ago, which she describes as being done at 2 and 10 oclock. Pt had not had a pap in several yrs before recent abnormal pap.    Her LMP started on Wednesday, 07/19/2018, and ended on Sunday, 07/23/2018. She has a family hx of cervical cancer. She notes that she has menorrhagia with clots the "size of golf balls".    Discussed role for HPV in cervical dysplasia, need for surveillance.  Patient given informed consent, signed copy in the chart, time out was performed.  Placed in lithotomy position. Cervix viewed with speculum and colposcope after application of acetic acid.   Colposcopy adequate? Yes No visible lesions  No inflammation on cervix. Well healed s/p LEEP, tissue firmer after LEEP. Light bleeding from the endocervix;   biopsies obtained at 3 o'clock and 6 o'clock.(container B) Biopsy at 12 o'clock. (container C)  ECC specimen obtained.Yes.before biopsies.  All specimens were labelled and sent to pathology.   Colposcopy IMPRESSION: Adequate colpo, NO visible dysplasia.  Patient was given post procedure instructions. Will follow up pathology and manage accordingly. Routine preventative health maintenance measures emphasized.    By signing my name below, I, Soijett Blue, attest that this documentation has been prepared under the direction and in the presence of Tilda Burrow, MD. Electronically Signed: Soijett Blue, Stage manager. 07/27/18. 10:52 AM.  I personally performed the services described in this documentation, which was SCRIBED in my presence. The recorded information has been reviewed and considered accurate. It has been edited as necessary during review. Tilda Burrow, MD

## 2018-08-02 ENCOUNTER — Ambulatory Visit (INDEPENDENT_AMBULATORY_CARE_PROVIDER_SITE_OTHER): Payer: Medicaid Other | Admitting: Obstetrics and Gynecology

## 2018-08-02 ENCOUNTER — Telehealth: Payer: Self-pay | Admitting: Obstetrics and Gynecology

## 2018-08-02 ENCOUNTER — Other Ambulatory Visit: Payer: Self-pay

## 2018-08-02 ENCOUNTER — Encounter: Payer: Self-pay | Admitting: Obstetrics and Gynecology

## 2018-08-02 VITALS — BP 127/75 | HR 88 | Ht 63.5 in | Wt 227.0 lb

## 2018-08-02 DIAGNOSIS — N870 Mild cervical dysplasia: Secondary | ICD-10-CM | POA: Insufficient documentation

## 2018-08-02 DIAGNOSIS — M549 Dorsalgia, unspecified: Secondary | ICD-10-CM | POA: Diagnosis not present

## 2018-08-02 LAB — POCT URINALYSIS DIPSTICK OB
Blood, UA: NEGATIVE
Glucose, UA: NEGATIVE
Ketones, UA: NEGATIVE
LEUKOCYTES UA: NEGATIVE
Nitrite, UA: NEGATIVE
POC,PROTEIN,UA: NEGATIVE

## 2018-08-02 NOTE — Patient Instructions (Signed)
You have the mildest of the cervical abnormalities, CIN I, or mild dysplasia.  Cervical Dysplasia  Cervical dysplasia is a condition in which a woman's cervix cells have abnormal changes. The cervix is the opening of the uterus (womb). It is located between the vagina and the uterus. Cervical dysplasia may be an early sign of cervical cancer. If left untreated, this condition may become more severe and may progress to cervical cancer. Early detection, treatment, and follow-up care are very important. What are the causes? Cervical dysplasia can be caused by a human papillomavirus (HPV) infection. HPV is the most common sexually transmitted infection (STI). HPV is spread from person to person through sexual contact. This includes oral, vaginal, or anal sex. What increases the risk? The following factors may make you more likely to develop this condition:  Having had a sexually transmitted infection (STI), such as herpes, chlamydia or HPV.  Becoming sexually active before age 25.  Having had more than one sexual partner.  Having a sexual partner who has multiple sexual partners.  Not using protection, such as a condom, during sex, especially with new sexual partners.  Having a history of cancer of the vagina or vulva.  Having a weakened body defense (immune) system.  Being the daughter of a woman who took diethylstilbestrol (DES) during pregnancy.  Having a family history of cervical cancer.  Smoking.  Using oral contraceptives, also called birth control pills.  Having had three or more full-term pregnancies. What are the signs or symptoms? There are usually no symptoms of this condition. If you do have symptoms, they may include:  Abnormal vaginal discharge.  Bleeding between periods or after sex.  Bleeding during menopause.  Pain during sex (dyspareunia). How is this diagnosed? A test called a Pap test may be done. During this test, cells are taken from the cervix and then  examined under a microscope. A test in which tissue is removed from the cervix (biopsy) may also be done if the Pap test is abnormal or if the cervix looks abnormal. How is this treated? Treatment varies based on the severity of the condition. Treatment may include:  Cryotherapy. During cryotherapy, the abnormal cells are frozen with a steel-tip instrument.  Loop electrosurgical excision procedure (LEEP). This procedure removes abnormal tissue from the cervix.  Surgery to remove abnormal tissue. This is usually done in more advanced cases. Surgical options include: ? A cone biopsy. This is a procedure in which the cervical canal and a portion of the center of the cervix are removed. ? Hysterectomy. This is a surgery in which the uterus and cervix are removed. Follow these instructions at home:  Take over-the-counter and prescription medicines only as told by your health care provider.  Do not use tampons, have sex, or douche until your health care provider says it is okay.  Keep follow-up visits as told by your health care provider. This is important. Women who have been treated for cervical dysplasia should have regular pelvic exams and Pap tests as told by their health care provider. How is this prevented?  Practice safe sex to help prevent sexually transmitted infections (STI) that may cause this condition.  Have regular Pap tests. Talk with your health care provider about how often you need these tests. Pap tests will help identify cell changes that can lead to cancer. Contact a health care provider if:  You develop genital warts. Get help right away if:  You have a fever.  You have abnormal vaginal discharge.  Your menstrual period is heavier than normal.  You develop bright red bleeding. This may include blood clots.  You have pain or cramps that get worse, and medicine does not help to relieve your pain.  You feel light-headed and you are unusually weak.  You have  fainting spells.  You have pain in the abdomen. Summary  Cervical dysplasia is a condition in which a woman's cervix cells have abnormal changes.  If left untreated, this condition may become more severe and may progress to cervical cancer.  Early detection, treatment, and follow-up care are very important in managing this condition.  Have regular pelvic exams and Pap tests. Talk with your health care provider about how often you need these tests. Pap tests will help identify cell changes that can lead to cancer. This information is not intended to replace advice given to you by your health care provider. Make sure you discuss any questions you have with your health care provider. Document Released: 06/21/2005 Document Revised: 06/24/2016 Document Reviewed: 06/24/2016 Elsevier Interactive Patient Education  2019 [employer].

## 2018-08-02 NOTE — Progress Notes (Addendum)
Patient ID: Alicia CorneaNicole Bouvier, female   DOB: [birth year], 43 y.o.   MRN: [id number]   [facility]  @DATE @            Patient name: Alicia Barnett MRN [id number]  Date of birth: [birth year]  CC & HPI:  Alicia Barnett is a 43 y.o. female presenting today for left sided cramping. Vagina is still swollen. She has degenerative back disease and is taking mobic for that. She had a bowel movement and pain stayed about the same. Pain is only on left side.  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  She is reluctant to seek medications through her primary care because she has an appointment late next month and she will only see her podiatrist, foot doctor, on the seventh.  After some consideration, I have declined to give her analgesics  Results of pap were discussed with patient  ROS:  ROS +left sided cramping -fever  All systems are negative except as noted in the HPI and PMH.    Pertinent History Reviewed:   Reviewed: Medical         Past Medical History:  Diagnosis Date   Anemia    Anxiety    Depression    Polysubstance abuse (HCC)    Sciatica    Vaginal Pap smear, abnormal                               Surgical Hx:    Past Surgical History:  Procedure Laterality Date   LAPAROSCOPIC BILATERAL SALPINGECTOMY Bilateral 02/13/2016   Procedure: LAPAROSCOPIC BILATERAL SALPINGECTOMY;  Surgeon: Tilda BurrowJohn V Ferguson, MD;  Location: [facility];  Service: Gynecology;  Laterality: Bilateral;   LEEP     NO PAST SURGERIES     TUBAL LIGATION     Medications: Reviewed & Updated - see associated section                       Current Outpatient Medications:    baclofen (LIORESAL) 10 MG tablet, Take 10 mg by mouth 4 (four) times daily as needed for muscle spasms., Disp: , Rfl:    clonazePAM (KLONOPIN) 2  MG tablet, Take 2 mg by mouth 2 (two) times daily., Disp: , Rfl:    DULoxetine (CYMBALTA) 60 MG capsule, Take 1 capsule (60 mg total) by mouth daily. (Patient taking differently: Take 120 mg by mouth daily. ), Disp: 90 capsule, Rfl: 3   gabapentin (NEURONTIN) 300 MG capsule, Take 600 mg by mouth 3 (three) times daily., Disp: , Rfl:    meloxicam (MOBIC) 15 MG tablet, Take 15 mg by mouth daily., Disp: , Rfl:    Social History: Reviewed -  reports that she has been smoking cigarettes. She has a 11.50 pack-year smoking history. She has never used smokeless tobacco.  Objective Findings:  Vitals: Blood pressure 127/75, pulse 88, height 5' 3.5" (1.613 m), weight 227 lb (103 kg), last menstrual period 07/19/2018.  PHYSICAL EXAMINATION General appearance - alert, well appearing, and in no distress and oriented to person, place, and time Mental status - alert, oriented to person, place, and time, normal mood, behavior, speech, dress, motor activity, and thought processes, affect appropriate to mood  PELVIC Vagina - normal Cervix -  normal clear mucous, healing from complo bimanual - tender to palpation of cervix,, felt related to the healing process.  There is no purulence or inflammation noted on the visual inspection of the cervix uterine body normal Adnexa no palpable masses.  Assessment & Plan:   A:  1.  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 Repeat Pap 1 year 2. F/u if cramping persists  3. Urinalysis done, negative   By signing my name below, I, Arnette Norris, attest that this documentation has been prepared under the direction and in the presence of Tilda Burrow, MD. Electronically Signed: Arnette Norris Medical Scribe. 08/02/18. 4:00 PM.  I personally performed the services described in this documentation, which was SCRIBED in my presence. The recorded information has been reviewed and considered accurate. It has been edited as necessary  during review. Tilda Burrow, MD

## 2018-08-02 NOTE — Addendum Note (Signed)
Addended by: Tish FredericksonLANCASTER, Jayani Rozman A on: 08/02/2018 04:46 PM   Modules accepted: Orders

## 2018-08-02 NOTE — Telephone Encounter (Signed)
Patient called, has an appointment tomorrow with Dr. Emelda Fear.  She stated she is cramping really badm hurting really bad and a black discharge.  Stated she is in such pain she can't get off the couch.  [phone]

## 2018-08-02 NOTE — Telephone Encounter (Signed)
Pt called stating that she is having bad cramping and black discharge and would like to be seen today. Advised that the black d/c is normal if he used monsels. Offered pt appt at 4 today instead of tomorrow and she accepted.

## 2018-08-03 ENCOUNTER — Ambulatory Visit: Payer: Medicaid Other | Admitting: Obstetrics and Gynecology

## 2019-04-22 ENCOUNTER — Emergency Department (HOSPITAL_COMMUNITY)
Admission: EM | Admit: 2019-04-22 | Discharge: 2019-04-22 | Disposition: A | Discharge status: Home / Self Care | Payer: Medicaid Other | Attending: Emergency Medicine | Admitting: Emergency Medicine

## 2019-04-22 ENCOUNTER — Emergency Department (HOSPITAL_COMMUNITY): Payer: Medicaid Other

## 2019-04-22 ENCOUNTER — Encounter (HOSPITAL_COMMUNITY): Payer: Self-pay

## 2019-04-22 ENCOUNTER — Other Ambulatory Visit: Payer: Self-pay

## 2019-04-22 DIAGNOSIS — Y9301 Activity, walking, marching and hiking: Secondary | ICD-10-CM | POA: Insufficient documentation

## 2019-04-22 DIAGNOSIS — Y9259 Other trade areas as the place of occurrence of the external cause: Secondary | ICD-10-CM | POA: Diagnosis not present

## 2019-04-22 DIAGNOSIS — Z87891 Personal history of nicotine dependence: Secondary | ICD-10-CM | POA: Diagnosis not present

## 2019-04-22 DIAGNOSIS — Y999 Unspecified external cause status: Secondary | ICD-10-CM | POA: Diagnosis not present

## 2019-04-22 DIAGNOSIS — W010XXA Fall on same level from slipping, tripping and stumbling without subsequent striking against object, initial encounter: Secondary | ICD-10-CM | POA: Insufficient documentation

## 2019-04-22 DIAGNOSIS — Z79899 Other long term (current) drug therapy: Secondary | ICD-10-CM | POA: Diagnosis not present

## 2019-04-22 DIAGNOSIS — S93401A Sprain of unspecified ligament of right ankle, initial encounter: Secondary | ICD-10-CM | POA: Diagnosis not present

## 2019-04-22 DIAGNOSIS — S99911A Unspecified injury of right ankle, initial encounter: Secondary | ICD-10-CM | POA: Diagnosis present

## 2019-04-22 MED ORDER — IBUPROFEN 800 MG PO TABS: 800.0000 mg | ORAL_TABLET | Freq: Three times a day (TID) | ORAL | 0 refills | Status: DC

## 2019-04-22 MED ORDER — IBUPROFEN 800 MG PO TABS: 800.0000 mg | ORAL_TABLET | Freq: Once | ORAL | Status: DC

## 2019-04-22 NOTE — Discharge Instructions (Addendum)
Wear the ASO brace to protect your ankle from re injury as this sprain heals.  Use ice and elevation as much as possible for the next several days to help reduce the swelling.  Use ibuprofen for pain relief and reduction in swelling.  Call your doctor for a recheck of your ankle within the next 7-10 days.   You may benefit from physical therapy of your ankle if not improving which you can discuss with your MD.  It can take several weeks however before your pain is completely resolved and you should continue wearing the brace while walking until you no longer have pain with movement to avoid re injury.

## 2019-04-22 NOTE — ED Provider Notes (Signed)
[facility] EMERGENCY DEPARTMENT Provider Note   CSN: [id number] Arrival date & time: 04/22/19  1761     History   Chief Complaint Chief Complaint  Patient presents with  . Ankle Pain  . Knee Pain    HPI Alicia Barnett is a 43 y.o. female presenting with right ankle pain and swelling from a twisting injury occurring just prior to arrival.  She was walking at work when her right ankle "gave out", and she fell forward landing on her left knee.  She denies pain at the knee but has significant pain and swelling at the right lateral ankle.  There is no radiation of pain and she was able to weight bear after the injury.  She has had no treatments prior to arrival.     The history is provided by the patient.  Knee Pain   Past Medical History:  Diagnosis Date  . Anemia   . Anxiety   . Depression   . Polysubstance abuse (HCC)   . Sciatica   . Vaginal Pap smear, abnormal     Patient Active Problem List   Diagnosis Date Noted  . CIN I (cervical intraepithelial neoplasia I) 08/02/2018  . Ganglion cyst of left foot 05/26/2016  . Uncomplicated opioid dependence (HCC) 11/30/2015  . Rubella non-immune status, antepartum 09/10/2015  . History of polysubstance abuse 06/17/2015  . Depression with anxiety 06/17/2015  . History of LEEP (loop electrosurgical excision procedure) of cervix complicating pregnancy 06/17/2015  . Smoker 06/17/2015    Past Surgical History:  Procedure Laterality Date  . LAPAROSCOPIC BILATERAL SALPINGECTOMY Bilateral 02/13/2016   Procedure: LAPAROSCOPIC BILATERAL SALPINGECTOMY;  Surgeon: Tilda Burrow, MD;  Location: [facility];  Service: Gynecology;  Laterality: Bilateral;  . LEEP    . NO PAST SURGERIES    . TUBAL LIGATION       OB History    Gravida  6   Para  4   Term  4   Preterm      AB  2   Living  4     SAB      TAB  2   Ectopic      Multiple      Live Births  4            Home Medications    Prior to Admission medications    Medication Sig Start Date End Date Taking? Authorizing Provider  baclofen (LIORESAL) 10 MG tablet Take 10 mg by mouth 4 (four) times daily as needed for muscle spasms.    [provider]  clonazePAM (KLONOPIN) 2 MG tablet Take 2 mg by mouth 2 (two) times daily.    [provider]  DULoxetine (CYMBALTA) 60 MG capsule Take 1 capsule (60 mg total) by mouth daily. Patient taking differently: Take 120 mg by mouth daily.  05/02/17   Eustace Moore, MD  gabapentin (NEURONTIN) 300 MG capsule Take 600 mg by mouth 3 (three) times daily.    [provider]  ibuprofen (ADVIL) 800 MG tablet Take 1 tablet (800 mg total) by mouth 3 (three) times daily. 04/22/19   Burgess Amor, PA-C  meloxicam (MOBIC) 15 MG tablet Take 15 mg by mouth daily.    [provider]    Family History Family History  Problem Relation Age of Onset  . Heart failure Mother   . Hypertension Mother   . Pulmonary embolism Mother 100       PE after surgery  . Cancer Maternal  Grandmother        lung  . Diabetes Maternal Grandfather   . Cancer Maternal Grandfather        liver  . Asthma Daughter   . Cancer Other     Social History Social History   Tobacco Use  . Smoking status: Former Smoker    Packs/day: 0.50    Years: 23.00    Pack years: 11.50    Types: Cigarettes  . Smokeless tobacco: Never Used  Substance Use Topics  . Alcohol use: Not Currently    Comment: beer occ  . Drug use: Yes    Types: Marijuana    Comment: "once in a while"     Allergies   Codeine   Review of Systems Review of Systems  Musculoskeletal: Positive for arthralgias and joint swelling.  Skin: Negative for wound.  Neurological: Negative for weakness and numbness.     Physical Exam Updated Vital Signs BP (!) 99/52 (BP Location: Right Arm)   Pulse 75   Temp 98.4 F (36.9 C) (Oral)   Resp 16   LMP 03/23/2019 Comment: Tubes removed  SpO2 100%   Physical Exam Vitals signs and nursing note  reviewed.  Constitutional:      Appearance: She is well-developed.  HENT:     Head: Normocephalic.  Cardiovascular:     Rate and Rhythm: Normal rate.     Pulses: No decreased pulses.          Dorsalis pedis pulses are 2+ on the right side and 2+ on the left side.       Posterior tibial pulses are 2+ on the right side and 2+ on the left side.  Musculoskeletal:        General: Tenderness present.     Left knee: She exhibits erythema. No tenderness found.     Right ankle: She exhibits decreased range of motion, swelling and ecchymosis. She exhibits normal pulse. Tenderness. Lateral malleolus tenderness found. No head of 5th metatarsal and no proximal fibula tenderness found. Achilles tendon normal.     Comments: Mild erythema anterior patella left, skin intact without abrasion.   No edema, pt displays FROM without discomfort.   Skin:    General: Skin is warm and dry.  Neurological:     Mental Status: She is alert.     Sensory: No sensory deficit.      ED Treatments / Results  Labs (all labs ordered are listed, but only abnormal results are displayed) Labs Reviewed - No data to display  EKG None  Radiology Dg Ankle Complete Right  Result Date: 04/22/2019 CLINICAL DATA:  Right ankle pain/injury EXAM: RIGHT ANKLE - COMPLETE 3+ VIEW COMPARISON:  None . FINDINGS: No fracture or dislocation is seen. The ankle mortise is intact. Mild dorsolateral soft tissue swelling. IMPRESSION: No fracture or dislocation is seen. Mild soft tissue swelling. Electronically Signed   By: Charline BillsSriyesh  Krishnan M.D.   On: 04/22/2019 09:12    Procedures Procedures (including critical care time)  Medications Ordered in ED Medications  ibuprofen (ADVIL) tablet 800 mg (has no administration in time range)     Initial Impression / Assessment and Plan / ED Course  I have reviewed the triage vital signs and the nursing notes.  Pertinent labs & imaging results that were available during my care of the  patient were reviewed by me and considered in my medical decision making (see chart for details).        ASO provided.  Cap refill  normal after ASO applied.  RICE, referral to pcp for recheck 7-10 days.  Discussed expected healing course.  xrays reviewed and discussed with pt.    Final Clinical Impressions(s) / ED Diagnoses   Final diagnoses:  Sprain of right ankle, unspecified ligament, initial encounter    ED Discharge Orders         Ordered    ibuprofen (ADVIL) 800 MG tablet  3 times daily     04/22/19 0917           Evalee Jefferson, PA-C 04/22/19 [id number]    Noemi Chapel, MD 04/24/19 [phone]

## 2019-04-22 NOTE — ED Triage Notes (Signed)
Pt reports that she was walking down driveway and right ankle gave away and rolled inward which caused her to fall and land on left knee

## 2019-05-14 ENCOUNTER — Emergency Department (HOSPITAL_COMMUNITY)
Admission: EM | Admit: 2019-05-14 | Discharge: 2019-05-14 | Disposition: A | Discharge status: Home / Self Care | Payer: Medicaid Other | Attending: Emergency Medicine | Admitting: Emergency Medicine

## 2019-05-14 ENCOUNTER — Emergency Department (HOSPITAL_COMMUNITY): Payer: Medicaid Other

## 2019-05-14 ENCOUNTER — Encounter (HOSPITAL_COMMUNITY): Payer: Self-pay | Admitting: Emergency Medicine

## 2019-05-14 ENCOUNTER — Other Ambulatory Visit: Payer: Self-pay

## 2019-05-14 DIAGNOSIS — L02215 Cutaneous abscess of perineum: Secondary | ICD-10-CM | POA: Diagnosis present

## 2019-05-14 DIAGNOSIS — Z79899 Other long term (current) drug therapy: Secondary | ICD-10-CM | POA: Insufficient documentation

## 2019-05-14 DIAGNOSIS — M25562 Pain in left knee: Secondary | ICD-10-CM | POA: Diagnosis not present

## 2019-05-14 DIAGNOSIS — Z87891 Personal history of nicotine dependence: Secondary | ICD-10-CM | POA: Insufficient documentation

## 2019-05-14 DIAGNOSIS — L0291 Cutaneous abscess, unspecified: Secondary | ICD-10-CM

## 2019-05-14 MED ORDER — DOXYCYCLINE HYCLATE 100 MG PO CAPS: 100.0000 mg | ORAL_CAPSULE | Freq: Two times a day (BID) | ORAL | 0 refills | Status: AC

## 2019-05-14 MED ORDER — HYDROCODONE-ACETAMINOPHEN 5-325 MG PO TABS
1.0000 | ORAL_TABLET | Freq: Once | ORAL | Status: AC
  Administered 2019-05-14: 1 via ORAL
  Filled 2019-05-14: qty 1

## 2019-05-14 MED ORDER — HYDROCODONE-ACETAMINOPHEN 5-325 MG PO TABS: 1.0000 | ORAL_TABLET | Freq: Every evening | ORAL | 0 refills | Status: DC | PRN

## 2019-05-14 MED ORDER — LIDOCAINE-EPINEPHRINE (PF) 2 %-1:200000 IJ SOLN
10.0000 mL | Freq: Once | INTRAMUSCULAR | Status: AC
  Administered 2019-05-14: 10 mL
  Filled 2019-05-14: qty 10

## 2019-05-14 NOTE — ED Triage Notes (Signed)
Pt states that she has a abscess in her right groin and her left is swelling and hurting.

## 2019-05-14 NOTE — Discharge Instructions (Addendum)
Please take Ibuprofen (Advil, motrin) and Tylenol (acetaminophen) to relieve your pain.  You may take up to 600 MG (3 pills) of normal strength ibuprofen every 8 hours as needed.  In between doses of ibuprofen you make take tylenol, up to 1,000 mg (two extra strength pills).  Do not take more than 3,000 mg tylenol in a 24 hour period.  Please check all medication labels as many medications such as pain and cold medications may contain tylenol.  Do not drink alcohol while taking these medications.  Do not take other NSAID'S while taking ibuprofen (such as aleve or naproxen).  Please take ibuprofen with food to decrease stomach upset.  You may have diarrhea from the antibiotics.  It is very important that you continue to take the antibiotics even if you get diarrhea unless a medical professional tells you that you may stop taking them.  If you stop too early the bacteria you are being treated for will become stronger and you may need different, more powerful antibiotics that have more side effects and worsening diarrhea.  Please stay well hydrated and consider probiotics as they may decrease the severity of your diarrhea.  Please be aware that if you take any hormonal contraception (birth control pills, nexplanon, the ring, etc) that your birth control will not work while you are taking antibiotics and you need to use back up protection as directed on the birth control medication information insert.   Today you received medications that may make you sleepy or impair your ability to make decisions.  For the next 24 hours please do not drive, operate heavy machinery, care for a small child with out another adult present, or perform any activities that may cause harm to you or someone else if you were to fall asleep or be impaired.   You are being prescribed a medication which may make you sleepy. Please follow up of listed precautions for at least 24 hours after taking one dose.  Do not take the hydrocodone  unless you absolutely need it.  We discussed that this medicine is an increased risk of relapse.  Do not take this with the Suboxone is doing so can cause overdose.  Please get Voltaren.  This is available over-the-counter as a replacement for ibuprofen that you can apply directly onto your knee.

## 2019-05-14 NOTE — ED Provider Notes (Signed)
[facility] EMERGENCY DEPARTMENT Provider Note   CSN: [id number] Arrival date & time: 05/14/19  1017     History   Chief Complaint Chief Complaint  Patient presents with   Abscess   Knee Pain    HPI Alicia Barnett is a 43 y.o. female with a past medical history of anxiety, depression, polysubstance abuse without injection drug use, reports she is currently off methadone/Suboxone, who presents today for evaluation of 2 complaints. 1.  Left knee pain: She reports that 2 weeks ago when she was here for her right ankle sprain she hurt her left knee in the same fall when she struck it on concrete.  She has not taken any Tylenol or ibuprofen.  No OTC medications.  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  She states that it feels better when she elevates it.  She denies any fevers, redness, wounds or stiffness.  No fevers.  2.  Abscess: She reports that on the right side of her mons pubis she developed a swelling about 2 weeks ago.  She thinks that this is an ingrown hair.  She has tried to shave it which exacerbated the condition.  She states she is also attempted to poke it with the end of a needle and squeeze it however it continues to get bigger.  She states that it hurts mostly when she is at work as it is pressed on by her pants causing increased pain.  She does not have a history of similar.  She denies any fevers, myalgias, arthralgias, headaches.       HPI  Past Medical History:  Diagnosis Date   Anemia    Anxiety    Depression    Polysubstance abuse (HCC)    Sciatica    Vaginal Pap smear, abnormal     Patient Active Problem List   Diagnosis Date Noted   CIN I (cervical intraepithelial neoplasia I) 08/02/2018   Ganglion cyst of left foot 05/26/2016   Uncomplicated opioid dependence (HCC) 11/30/2015   Rubella non-immune status, antepartum 09/10/2015   History of polysubstance abuse 06/17/2015   Depression with  anxiety 06/17/2015   History of LEEP (loop electrosurgical excision procedure) of cervix complicating pregnancy 06/17/2015   Smoker 06/17/2015    Past Surgical History:  Procedure Laterality Date   LAPAROSCOPIC BILATERAL SALPINGECTOMY Bilateral 02/13/2016   Procedure: LAPAROSCOPIC BILATERAL SALPINGECTOMY;  Surgeon: Tilda Burrow, MD;  Location: [facility];  Service: Gynecology;  Laterality: Bilateral;   LEEP     NO PAST SURGERIES     TUBAL LIGATION       OB History    Gravida  6   Para  4   Term  4   Preterm      AB  2   Living  4     SAB      TAB  2   Ectopic      Multiple      Live Births  4            Home Medications    Prior to Admission medications   Medication Sig Start Date End Date Taking? Authorizing Provider  baclofen (LIORESAL) 10 MG tablet Take 10 mg by mouth 4 (four) times daily as needed for muscle spasms.    [provider]  clonazePAM (KLONOPIN) 2 MG tablet Take 2 mg by mouth 2 (two) times daily.    [provider]  doxycycline (VIBRAMYCIN)  100 MG capsule Take 1 capsule (100 mg total) by mouth 2 (two) times daily for 7 days. 05/14/19 05/21/19  Cristina Gong, PA-C  DULoxetine (CYMBALTA) 60 MG capsule Take 1 capsule (60 mg total) by mouth daily. Patient taking differently: Take 120 mg by mouth daily.  05/02/17   Eustace Moore, MD  gabapentin (NEURONTIN) 300 MG capsule Take 600 mg by mouth 3 (three) times daily.    [provider]  HYDROcodone-acetaminophen (NORCO/VICODIN) 5-325 MG tablet Take 1 tablet by mouth at bedtime as needed for severe pain. 05/14/19   Cristina Gong, PA-C  ibuprofen (ADVIL) 800 MG tablet Take 1 tablet (800 mg total) by mouth 3 (three) times daily. 04/22/19   Burgess Amor, PA-C  meloxicam (MOBIC) 15 MG tablet Take 15 mg by mouth daily.    [provider]    Family History Family History  Problem Relation Age of Onset   Heart failure Mother    Hypertension Mother     Pulmonary embolism Mother 26       PE after surgery   Cancer Maternal Grandmother        lung   Diabetes Maternal Grandfather    Cancer Maternal Grandfather        liver   Asthma Daughter    Cancer Other     Social History Social History   Tobacco Use   Smoking status: Former Smoker    Packs/day: 0.50    Years: 23.00    Pack years: 11.50    Types: Cigarettes   Smokeless tobacco: Never Used  Substance Use Topics   Alcohol use: Not Currently    Comment: beer occ   Drug use: Yes    Types: Marijuana    Comment: "once in a while"     Allergies   Codeine   Review of Systems Review of Systems  Constitutional: Negative for chills and fever.  HENT: Negative for congestion.   Cardiovascular: Negative for chest pain.  Gastrointestinal: Negative for abdominal pain.  Musculoskeletal:       Knee pain  Skin: Positive for wound.  Neurological: Negative for weakness and headaches.  All other systems reviewed and are negative.    Physical Exam Updated Vital Signs BP 140/78 (BP Location: Right Arm)    Pulse 75    Temp 98 F (36.7 C) (Oral)    Resp 12    Ht 5' 3.5" (1.613 m)    Wt 110.2 kg    LMP 04/29/2019    SpO2 100%    BMI 42.37 kg/m   Physical Exam Vitals signs and nursing note reviewed.  Constitutional:      General: She is not in acute distress.    Appearance: She is not ill-appearing.  HENT:     Head: Normocephalic.  Cardiovascular:     Rate and Rhythm: Normal rate.  Pulmonary:     Effort: Pulmonary effort is normal. No respiratory distress.  Musculoskeletal:     Comments: Left knee is tender to palpation primarily on the medial aspect and on the anterior part.  Left knee is grossly stable to anterior/posterior drawer test and valgus/varus stress.  No crepitus or deformities palpated.  She is able to bend the knee past 90 degrees without pain or significant difficulty.  There is no abnormal edema or erythema present over the knee.    Skin:     Comments: Left knee without abnormal redness, skin wounds, abrasions, or lacerations.  There is a 1 cm x  2 cm area of swelling over the right sided mons pubis.  This area has mild centralized fluctuance with surrounding induration.  This area is slightly erythematous.  No active drainage.  Neurological:     General: No focal deficit present.     Mental Status: She is alert.  Psychiatric:        Mood and Affect: Mood normal.        Behavior: Behavior normal.      ED Treatments / Results  Labs (all labs ordered are listed, but only abnormal results are displayed) Labs Reviewed - No data to display  EKG None  Radiology Dg Knee Complete 4 Views Left  Result Date: 05/14/2019 CLINICAL DATA:  Left knee pain following a fall on concrete several days ago. EXAM: LEFT KNEE - COMPLETE 4+ VIEW COMPARISON:  None. FINDINGS: Mild spur formation involving all 3 joint compartments, most pronounced involving the medial compartment. No fracture, dislocation or effusion seen. IMPRESSION: Mild tricompartmental degenerative changes. No fracture. Electronically Signed   By: Claudie Revering M.D.   On: 05/14/2019 12:33    Procedures .Marland KitchenIncision and Drainage  Date/Time: 05/14/2019 3:36 PM Performed by: Lorin Glass, PA-C Authorized by: Lorin Glass, PA-C   Consent:    Consent obtained:  Verbal   Consent given by:  Patient   Risks discussed:  Bleeding, incomplete drainage, pain and infection (Damage to other structures, need for additional procedures)   Alternatives discussed:  No treatment, alternative treatment and referral Location:    Type:  Abscess   Size:  1cmx2cm   Location:  Anogenital   Anogenital location: Right sided mons pubis. Pre-procedure details:    Skin preparation:  Chloraprep Anesthesia (see MAR for exact dosages):    Anesthesia method:  Local infiltration   Local anesthetic:  Lidocaine 2% WITH epi Procedure type:    Complexity:  Complex Procedure details:     Incision types:  Stab incision   Incision depth:  Subcutaneous   Scalpel blade:  11   Wound management:  Probed and deloculated and irrigated with saline   Drainage:  Purulent   Drainage amount:  Scant   Wound treatment:  Wound left open   Packing materials:  None Post-procedure details:    Patient tolerance of procedure:  Tolerated well, no immediate complications Ultrasound ED Soft Tissue  Date/Time: 05/14/2019 3:37 PM Performed by: Lorin Glass, PA-C Authorized by: Lorin Glass, PA-C   Procedure details:    Indications: localization of abscess and evaluate for cellulitis     Transverse view:  Visualized   Longitudinal view:  Visualized   Images: archived     Visualization limited by: None. Location:    Location comment:  Mons pubis   Side:  Right Findings:     abscess present    cellulitis present   (including critical care time)  Medications Ordered in ED Medications  lidocaine-EPINEPHrine (XYLOCAINE W/EPI) 2 %-1:200000 (PF) injection 10 mL (10 mLs Infiltration Given 05/14/19 1452)  HYDROcodone-acetaminophen (NORCO/VICODIN) 5-325 MG per tablet 1 tablet (1 tablet Oral Given 05/14/19 1452)     Initial Impression / Assessment and Plan / ED Course  I have reviewed the triage vital signs and the nursing notes.  Pertinent labs & imaging results that were available during my care of the patient were reviewed by me and considered in my medical decision making (see chart for details).       Patient presents today for evaluation of 2 complaints. 1: Left knee pain  she fell hitting her left knee 2 weeks ago.  Exam is not consistent with septic arthritis that she has intact range of motion without evidence of wounds, abnormal erythema or edema.  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  Recommended rice, conservative care, PCP follow-up.  Recommended voltaren gel.   2.  abscess: She has a 1 cm x 2 cm superficial abscess on the right sided mons  pubis.  This was localized with ultrasound confirming there is a easily drainable fluid pocket.  I&D was performed without significant difficulty.   Based on the location and surrounding induration along with the symptoms being ongoing for 2 weeks we will start on a course of Vicodin.  She reports inability to tolerate Ibuprofen.  She has a history of substance abuse and [place] PMP shows that she has active RX for suboxone, however she reports that she has not taken any in 10 days, is done with it and is now seeing Dr. Wynelle LinkSun.  We discussed the use of narcotic pain medication in the setting of recent narcotic use.  We also discussed that if she has an active pain contract getting rx may violate her contract. We explicitly discussed the risk of relapse for which she states her understanding.  As she had I and D will give dose of hydrocodone here.  She will be discharged with 3 pills of hydrocodone to help with pain, primarily for at night.  She has an appointment on Friday with her PCP.    Return precautions were discussed with patient who states their understanding.  At the time of discharge patient denied any unaddressed complaints or concerns.  Patient is agreeable for discharge home.    Final Clinical Impressions(s) / ED Diagnoses   Final diagnoses:  Abscess  Acute pain of left knee    ED Discharge Orders         Ordered    doxycycline (VIBRAMYCIN) 100 MG capsule  2 times daily     05/14/19 1433    HYDROcodone-acetaminophen (NORCO/VICODIN) 5-325 MG tablet  At bedtime PRN     05/14/19 1433           Cristina GongHammond, Alfonsa Vaile W, Cordelia Poche-C 05/14/19 1548    Bethann BerkshireZammit, Joseph, MD 05/15/19 [phone]

## 2019-06-14 ENCOUNTER — Other Ambulatory Visit: Payer: Self-pay | Admitting: Nurse Practitioner

## 2019-06-14 DIAGNOSIS — R200 Anesthesia of skin: Secondary | ICD-10-CM

## 2019-06-14 DIAGNOSIS — R202 Paresthesia of skin: Secondary | ICD-10-CM

## 2019-06-15 ENCOUNTER — Other Ambulatory Visit: Payer: Self-pay

## 2019-06-15 DIAGNOSIS — Z20822 Contact with and (suspected) exposure to covid-19: Secondary | ICD-10-CM

## 2019-06-16 LAB — NOVEL CORONAVIRUS, NAA: SARS-CoV-2, NAA: NOT DETECTED

## 2020-05-31 ENCOUNTER — Emergency Department (HOSPITAL_COMMUNITY)
Admission: EM | Admit: 2020-05-31 | Discharge: 2020-05-31 | Disposition: A | Discharge status: Home / Self Care | Payer: Medicaid Other | Attending: Emergency Medicine | Admitting: Emergency Medicine

## 2020-05-31 ENCOUNTER — Other Ambulatory Visit: Payer: Self-pay

## 2020-05-31 ENCOUNTER — Emergency Department (HOSPITAL_COMMUNITY): Payer: Medicaid Other

## 2020-05-31 ENCOUNTER — Encounter (HOSPITAL_COMMUNITY): Payer: Self-pay | Admitting: *Deleted

## 2020-05-31 DIAGNOSIS — S0990XA Unspecified injury of head, initial encounter: Secondary | ICD-10-CM | POA: Diagnosis not present

## 2020-05-31 DIAGNOSIS — R55 Syncope and collapse: Secondary | ICD-10-CM | POA: Diagnosis not present

## 2020-05-31 DIAGNOSIS — Z87891 Personal history of nicotine dependence: Secondary | ICD-10-CM | POA: Insufficient documentation

## 2020-05-31 DIAGNOSIS — R519 Headache, unspecified: Secondary | ICD-10-CM | POA: Insufficient documentation

## 2020-05-31 DIAGNOSIS — S40022A Contusion of left upper arm, initial encounter: Secondary | ICD-10-CM | POA: Diagnosis not present

## 2020-05-31 DIAGNOSIS — Z79899 Other long term (current) drug therapy: Secondary | ICD-10-CM | POA: Insufficient documentation

## 2020-05-31 DIAGNOSIS — S0181XA Laceration without foreign body of other part of head, initial encounter: Secondary | ICD-10-CM | POA: Insufficient documentation

## 2020-05-31 MED ORDER — LIDOCAINE-EPINEPHRINE (PF) 1 %-1:200000 IJ SOLN
10.0000 mL | Freq: Once | INTRAMUSCULAR | Status: AC
  Administered 2020-05-31: 10 mL via INTRADERMAL
  Filled 2020-05-31: qty 30

## 2020-05-31 MED ORDER — OXYCODONE-ACETAMINOPHEN 5-325 MG PO TABS
1.0000 | ORAL_TABLET | Freq: Once | ORAL | Status: AC
  Administered 2020-05-31: 1 via ORAL
  Filled 2020-05-31: qty 1

## 2020-05-31 MED ORDER — POVIDONE-IODINE 10 % EX SOLN
CUTANEOUS | Status: DC | PRN
  Filled 2020-05-31: qty 15
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
## 2020-05-31 NOTE — Discharge Instructions (Addendum)
Your x-rays and CT scans were reassuring.  You may experience intermittent headaches and fatigue for several days, this is normal after a head injury.  Avoid heavy lifting or straining for 1 week.  You may take Tylenol or ibuprofen if needed for pain.  Apply ice packs on and off to your arm and to your forehead to help reduce swelling and bruising.  Clean the laceration with mild soap and water.  Sutures will need to be removed in 7 days.  Return to the emergency department if you develop any worsening symptoms.
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
## 2020-07-18 ENCOUNTER — Emergency Department (HOSPITAL_COMMUNITY): Payer: Medicaid Other

## 2020-07-18 ENCOUNTER — Encounter (HOSPITAL_COMMUNITY): Payer: Self-pay | Admitting: *Deleted

## 2020-07-18 ENCOUNTER — Other Ambulatory Visit: Payer: Self-pay

## 2020-07-18 ENCOUNTER — Emergency Department (HOSPITAL_COMMUNITY)
Admission: EM | Admit: 2020-07-18 | Discharge: 2020-07-18 | Disposition: A | Discharge status: Home / Self Care | Payer: Medicaid Other | Attending: Emergency Medicine | Admitting: Emergency Medicine

## 2020-07-18 DIAGNOSIS — S99911A Unspecified injury of right ankle, initial encounter: Secondary | ICD-10-CM | POA: Diagnosis present

## 2020-07-18 DIAGNOSIS — X500XXA Overexertion from strenuous movement or load, initial encounter: Secondary | ICD-10-CM | POA: Insufficient documentation

## 2020-07-18 DIAGNOSIS — S93431A Sprain of tibiofibular ligament of right ankle, initial encounter: Secondary | ICD-10-CM | POA: Insufficient documentation

## 2020-07-18 DIAGNOSIS — Y92003 Bedroom of unspecified non-institutional (private) residence as the place of occurrence of the external cause: Secondary | ICD-10-CM | POA: Insufficient documentation

## 2020-07-18 DIAGNOSIS — S93491A Sprain of other ligament of right ankle, initial encounter: Secondary | ICD-10-CM

## 2020-07-18 DIAGNOSIS — Z87891 Personal history of nicotine dependence: Secondary | ICD-10-CM | POA: Insufficient documentation

## 2020-07-18 MED ORDER — IBUPROFEN 800 MG PO TABS
800.0000 mg | ORAL_TABLET | Freq: Once | ORAL | Status: AC
  Administered 2020-07-18: 800 mg via ORAL
  Filled 2020-07-18: qty 1

## 2020-07-18 MED ORDER — ACETAMINOPHEN 500 MG PO TABS
1000.0000 mg | ORAL_TABLET | Freq: Once | ORAL | Status: AC
  Administered 2020-07-18: 1000 mg via ORAL
  Filled 2020-07-18: qty 2

## 2020-07-18 NOTE — Discharge Instructions (Addendum)
Your x-ray is negative, you have an ankle sprain.  Use ibuprofen or Aleve and Tylenol, use ice for the first 2 days and then alternate ice and heat, elevate the ankle is much as possible, use crutches and brace.  Follow-up with your PCP or orthopedist if pain is not improving after about 1 week.

## 2020-07-18 NOTE — ED Provider Notes (Signed)
[facility] EMERGENCY DEPARTMENT Provider Note   CSN: [id number] Arrival date & time: 07/18/20  1233     History Chief Complaint  Patient presents with  . Ankle Pain    Alicia Barnett is a 45 y.o. female.  Alicia Barnett is a 45 y.o. female with history of polysubstance abuse, anemia, depression, anxiety, who presents to the ED for evaluation of right ankle pain.  Patient states last night she was walking in her bedroom and rolled her ankle.  She reports overnight she did okay but when she woke up this morning the pain was worse and she had swelling over the outer aspect of her ankle.  Pain is worse with any weightbearing.  Took some Aleve as well as her home prescribed Suboxone and gabapentin with minimal improvement this morning.  Unable to walk on it.  Some swelling but no bruising, discoloration or wounds.  Normal sensation.  No other aggravating or alleviating factors.        Past Medical History:  Diagnosis Date  . Anemia   . Anxiety   . Depression   . Polysubstance abuse (HCC)   . Sciatica   . Vaginal Pap smear, abnormal     Patient Active Problem List   Diagnosis Date Noted  . CIN I (cervical intraepithelial neoplasia I) 08/02/2018  . Ganglion cyst of left foot 05/26/2016  . Uncomplicated opioid dependence (HCC) 11/30/2015  . Rubella non-immune status, antepartum 09/10/2015  . History of polysubstance abuse 06/17/2015  . Depression with anxiety 06/17/2015  . History of LEEP (loop electrosurgical excision procedure) of cervix complicating pregnancy 06/17/2015  . Smoker 06/17/2015    Past Surgical History:  Procedure Laterality Date  . LAPAROSCOPIC BILATERAL SALPINGECTOMY Bilateral 02/13/2016   Procedure: LAPAROSCOPIC BILATERAL SALPINGECTOMY;  Surgeon: Tilda Burrow, MD;  Location: [facility];  Service: Gynecology;  Laterality: Bilateral;  . LEEP    . NO PAST SURGERIES    . TUBAL LIGATION       OB History    Gravida  6   Para  4   Term  4   Preterm      AB   2   Living  4     SAB      IAB  2   Ectopic      Multiple      Live Births  4           Family History  Problem Relation Age of Onset  . Heart failure Mother   . Hypertension Mother   . Pulmonary embolism Mother 50       PE after surgery  . Cancer Maternal Grandmother        lung  . Diabetes Maternal Grandfather   . Cancer Maternal Grandfather        liver  . Asthma Daughter   . Cancer Other     Social History   Tobacco Use  . Smoking status: Former Smoker    Packs/day: 0.50    Years: 23.00    Pack years: 11.50    Types: Cigarettes  . Smokeless tobacco: Never Used  Vaping Use  . Vaping Use: Every day  Substance Use Topics  . Alcohol use: Not Currently    Comment: beer occ  . Drug use: Yes    Types: Marijuana    Comment: "once in a while"    Home Medications Prior to Admission medications   Medication Sig Start Date End Date Taking? Authorizing Provider  baclofen (LIORESAL)  10 MG tablet Take 10 mg by mouth 4 (four) times daily as needed for muscle spasms.    [provider]  clonazePAM (KLONOPIN) 2 MG tablet Take 2 mg by mouth 2 (two) times daily.    [provider]  DULoxetine (CYMBALTA) 60 MG capsule Take 1 capsule (60 mg total) by mouth daily. Patient taking differently: Take 120 mg by mouth daily.  05/02/17   Eustace Moore, MD  gabapentin (NEURONTIN) 300 MG capsule Take 600 mg by mouth 3 (three) times daily.    [provider]  HYDROcodone-acetaminophen (NORCO/VICODIN) 5-325 MG tablet Take 1 tablet by mouth at bedtime as needed for severe pain. 05/14/19   Cristina Gong, PA-C  ibuprofen (ADVIL) 800 MG tablet Take 1 tablet (800 mg total) by mouth 3 (three) times daily. 04/22/19   Burgess Amor, PA-C  meloxicam (MOBIC) 15 MG tablet Take 15 mg by mouth daily.    [provider]    Allergies    Codeine  Review of Systems   Review of Systems  Constitutional: Negative for chills and fever.   Musculoskeletal: Positive for arthralgias and joint swelling. Negative for myalgias.  Skin: Negative for color change and rash.  Neurological: Negative for weakness and numbness.  All other systems reviewed and are negative.   Physical Exam Updated Vital Signs BP (!) 111/93 (BP Location: Right Arm)   Pulse (!) 106   Temp 97.8 F (36.6 C) (Oral)   Resp 16   Ht 5\' 4"  (1.626 m)   Wt 99.8 kg   LMP 07/06/2020   SpO2 99%   BMI 37.76 kg/m   Physical Exam Vitals and nursing note reviewed.  Constitutional:      General: She is not in acute distress.    Appearance: Normal appearance. She is well-developed and well-nourished. She is not diaphoretic.  HENT:     Head: Normocephalic and atraumatic.  Eyes:     General:        Right eye: No discharge.        Left eye: No discharge.  Pulmonary:     Effort: Pulmonary effort is normal. No respiratory distress.  Musculoskeletal:        General: No deformity.     Comments: There is swelling and tenderness over the lateral malleolus.No overt deformity. No tenderness over the medial aspect of the ankle. The fifth metatarsal is not tender. The ankle joint is intact without excessive opening on stressing. Distal pulses 2+, normal sensation  Skin:    General: Skin is warm and dry.  Neurological:     Mental Status: She is alert and oriented to person, place, and time.     Coordination: Coordination normal.  Psychiatric:        Mood and Affect: Mood and affect normal.        Behavior: Behavior normal.     ED Results / Procedures / Treatments   Labs (all labs ordered are listed, but only abnormal results are displayed) Labs Reviewed - No data to display  EKG None  Radiology DG Ankle Complete Right  Result Date: 07/18/2020 CLINICAL DATA:  Twisting injury right ankle in a fall last night. Initial encounter. EXAM: RIGHT ANKLE - COMPLETE 3+ VIEW COMPARISON:  Plain films right ankle 04/22/2019. FINDINGS: There is no evidence of fracture,  dislocation, or joint effusion. There is no evidence of arthropathy or other focal bone abnormality. Soft tissues are unremarkable. IMPRESSION: Negative exam. Electronically Signed   By: 04/24/2019  M.D.   On: 07/18/2020 12:55    Procedures Procedures (including critical care time)  Medications Ordered in ED Medications  ibuprofen (ADVIL) tablet 800 mg (800 mg Oral Given 07/18/20 1344)  acetaminophen (TYLENOL) tablet 1,000 mg (1,000 mg Oral Given 07/18/20 1344)    ED Course  I have reviewed the triage vital signs and the nursing notes.  Pertinent labs & imaging results that were available during my care of the patient were reviewed by me and considered in my medical decision making (see chart for details).    MDM Rules/Calculators/A&P                        Presentation consistent with ankle sprain. Tenderness and swelling over lateral malleolus, pt is neurovascularly intact. X-ray reviewed by myself, agree with radiologist findings, negative for fracture, and shows ankle mortise is intact. Pain treated in the ED. Pt placed in ASO brace and provided crutches, ambulated without difficulty. Pt stable for discharge home with NSAIDs & RICE therapy. Pt to follow-up with ortho in one week if symptoms not improving. Return precautions discussed, Pt expresses understanding and agrees with plan.  Final Clinical Impression(s) / ED Diagnoses Final diagnoses:  Sprain of anterior talofibular ligament of right ankle, initial encounter    Rx / DC Orders ED Discharge Orders    None       Legrand Rams 07/18/20 1407    Bethann Berkshire, MD 07/19/20 [phone]

## 2020-07-18 NOTE — ED Triage Notes (Signed)
Pt with right ankle pain since rolling it last night.  Pt states unable to put weight on ankle.

## 2021-06-15 ENCOUNTER — Emergency Department (HOSPITAL_COMMUNITY)
Admission: EM | Admit: 2021-06-15 | Discharge: 2021-06-15 | Disposition: A | Discharge status: Home / Self Care | Payer: No Typology Code available for payment source | Source: Home / Self Care

## 2021-12-20 ENCOUNTER — Other Ambulatory Visit: Payer: Self-pay

## 2021-12-20 ENCOUNTER — Emergency Department (HOSPITAL_COMMUNITY)
Admission: EM | Admit: 2021-12-20 | Discharge: 2021-12-20 | Disposition: A | Discharge status: Home / Self Care | Payer: Medicaid Other | Attending: Emergency Medicine | Admitting: Emergency Medicine

## 2021-12-20 ENCOUNTER — Encounter (HOSPITAL_COMMUNITY): Payer: Self-pay | Admitting: Emergency Medicine

## 2021-12-20 DIAGNOSIS — L0291 Cutaneous abscess, unspecified: Secondary | ICD-10-CM

## 2021-12-20 DIAGNOSIS — R2241 Localized swelling, mass and lump, right lower limb: Secondary | ICD-10-CM | POA: Diagnosis present

## 2021-12-20 DIAGNOSIS — B372 Candidiasis of skin and nail: Secondary | ICD-10-CM | POA: Diagnosis not present

## 2021-12-20 MED ORDER — DOXYCYCLINE HYCLATE 100 MG PO CAPS: 100.0000 mg | ORAL_CAPSULE | Freq: Two times a day (BID) | ORAL | 0 refills | Status: AC

## 2021-12-20 MED ORDER — CLOTRIMAZOLE 1 % EX CREA: TOPICAL_CREAM | CUTANEOUS | 0 refills | Status: AC

## 2021-12-20 MED ORDER — LIDOCAINE-EPINEPHRINE (PF) 2 %-1:200000 IJ SOLN
10.0000 mL | Freq: Once | INTRAMUSCULAR | Status: AC
  Administered 2021-12-20: 10 mL via INTRADERMAL
  Filled 2021-12-20: qty 20

## 2021-12-20 MED ORDER — HYDROCODONE-ACETAMINOPHEN 5-325 MG PO TABS
1.0000 | ORAL_TABLET | Freq: Once | ORAL | Status: AC
  Administered 2021-12-20: 1 via ORAL
  Filled 2021-12-20: qty 1

## 2021-12-20 MED ORDER — HYDROCODONE-ACETAMINOPHEN 5-325 MG PO TABS: 1.0000 | ORAL_TABLET | ORAL | 0 refills | Status: AC | PRN

## 2021-12-20 MED ORDER — DOXYCYCLINE HYCLATE 100 MG PO TABS
100.0000 mg | ORAL_TABLET | Freq: Once | ORAL | Status: AC
  Administered 2021-12-20: 100 mg via ORAL
  Filled 2021-12-20: qty 1

## 2021-12-20 NOTE — ED Provider Notes (Signed)
[facility] EMERGENCY DEPARTMENT Provider Note   CSN: [id number] Arrival date & time: 12/20/21  1726     History  Chief Complaint  Patient presents with   Abscess    Alicia Barnett is a 46 y.o. female.   Abscess Associated symptoms: no fever         Alicia Barnett is a 46 y.o. female who presents to the Emergency Department complaining of pain, burning, and swelling of the right inner thigh and groin area.  Symptoms present for 3 days.  She noticed a small "bump" to the area at onset.  She attempted to squeeze the area thinking that this was a pimple and she is also applied warm wet compresses and soaks.  Pain swelling became worse yesterday.  No drainage of the area.  She also complains of redness and burning sensation to her groin and upper thigh area.  No history of diabetes, no prior staph infection.  She denies fever, chills, abdominal pain vaginal pain or discharge.   Home Medications Prior to Admission medications   Medication Sig Start Date End Date Taking? Authorizing Provider  baclofen (LIORESAL) 10 MG tablet Take 10 mg by mouth 4 (four) times daily as needed for muscle spasms.    [provider]  clonazePAM (KLONOPIN) 2 MG tablet Take 2 mg by mouth 2 (two) times daily.    [provider]  DULoxetine (CYMBALTA) 60 MG capsule Take 1 capsule (60 mg total) by mouth daily. Patient taking differently: Take 120 mg by mouth daily.  05/02/17   Eustace Moore, MD  gabapentin (NEURONTIN) 300 MG capsule Take 600 mg by mouth 3 (three) times daily.    [provider]  HYDROcodone-acetaminophen (NORCO/VICODIN) 5-325 MG tablet Take 1 tablet by mouth at bedtime as needed for severe pain. 05/14/19   Cristina Gong, PA-C  ibuprofen (ADVIL) 800 MG tablet Take 1 tablet (800 mg total) by mouth 3 (three) times daily. 04/22/19   Burgess Amor, PA-C  meloxicam (MOBIC) 15 MG tablet Take 15 mg by mouth daily.    [provider]      Allergies    Codeine     Review of Systems   Review of Systems  Constitutional:  Negative for chills and fever.  Respiratory:  Negative for chest tightness and shortness of breath.   Cardiovascular:  Negative for chest pain.  Gastrointestinal:  Negative for abdominal pain.  Genitourinary:  Negative for dysuria, vaginal bleeding, vaginal discharge and vaginal pain.  Skin:  Positive for color change.       Redness and swelling of the right upper thigh  Neurological:  Negative for weakness and numbness.    Physical Exam Updated Vital Signs BP 131/77 (BP Location: Right Arm)   Pulse 96   Temp 98.6 F (37 C) (Oral)   Resp 16   Ht 5\' 4"  (1.626 m)   Wt 122.5 kg   LMP 12/03/2021 (Approximate)   SpO2 100%   BMI 46.35 kg/m  Physical Exam Vitals and nursing note reviewed.  Constitutional:      General: She is not in acute distress.    Appearance: Normal appearance. She is not ill-appearing or toxic-appearing.  Cardiovascular:     Rate and Rhythm: Normal rate and regular rhythm.     Pulses: Normal pulses.  Pulmonary:     Effort: Pulmonary effort is normal.     Breath sounds: Normal breath sounds.  Abdominal:     Palpations: Abdomen is soft.  Tenderness: There is no abdominal tenderness.  Musculoskeletal:        General: Normal range of motion.  Skin:    General: Skin is warm.     Capillary Refill: Capillary refill takes less than 2 seconds.     Findings: Erythema present.     Comments: Localized area of erythema to right upper innermost thigh, 6 cm of surrounding erythema with 3 cm central area of induration, no fluctuance or drainage.  No apparent bite marks.  There is also confluent erythema extending into the right intertriginous folds.  No edema of the groin.  Neurological:     General: No focal deficit present.     Mental Status: She is alert.     Sensory: No sensory deficit.     Motor: No weakness.    ED Results / Procedures / Treatments   Labs (all labs ordered are listed, but only  abnormal results are displayed) Labs Reviewed - No data to display  EKG None  Radiology No results found.  Procedures Procedures     INCISION AND DRAINAGE Performed by: Zayde Stroupe Consent: Verbal consent obtained. Risks and benefits: risks, benefits and alternatives were discussed Type: abscess  Body area: right upper thigh  Anesthesia: local infiltration   Local anesthetic: lidocaine 2% with epinephrine  Anesthetic total: 3 ml  Area was anesthetized with lidocaine and 18-gauge needle used to attempt aspiration.  After 3 attempts, no purulence or aspirate noted. Small amount or bleeding.  Controlled with pressure  Complexity: simple   Drainage: none   Patient tolerance: Patient tolerated the procedure well with no immediate complications.   Medications Ordered in ED Medications  lidocaine-EPINEPHrine (XYLOCAINE W/EPI) 2 %-1:200000 (PF) injection 10 mL (has no administration in time range)  doxycycline (VIBRA-TABS) tablet 100 mg (has no administration in time range)  HYDROcodone-acetaminophen (NORCO/VICODIN) 5-325 MG per tablet 1 tablet (has no administration in time range)    ED Course/ Medical Decision Making/ A&P                           Medical Decision Making Risk Prescription drug management.   Patient here for evaluation of localized swelling to the right inner upper thigh.  Noticed a small bump to the area that she has squeezed several times.    On exam there is a localized area of induration with some surrounding erythema.  This may represent a developing abscess although I could not appreciate an abscess with attempted needle aspiration.  This could also represent swelling due to trauma of the skin and tissue.  She does appear to have Candida of the skin in the intertriginous folds along the right groin.  She is well-appearing nontoxic.  Vital signs are reassuring.  There is no abdominal tenderness on exam no pelvic symptoms.  I feel she  would benefit from antibiotics and will prescribe antifungal cream.  She is agreeable to this plan.  She will follow-up closely with primary care provider.  Return precautions were discussed.        Final Clinical Impression(s) / ED Diagnoses Final diagnoses:  Abscess  Candidal intertrigo    Rx / DC Orders ED Discharge Orders     None         Pauline Aus, PA-C 12/22/21 1357    Vanetta Mulders, MD 01/02/22 2324

## 2021-12-20 NOTE — Discharge Instructions (Signed)
Take the antibiotic as directed until its finished.  As discussed, use the cream as directed but dry the skin will before applying the cream.  You may use a hair dryer on a cool setting to dry the skin.  Please follow-up with your primary care provider for recheck, return emergency department for any new or worsening symptoms.

## 2021-12-20 NOTE — ED Triage Notes (Signed)
Pt states she has a baseball size mass/cyst in her inner thigh that began 3 days ago. The pt denies drainage or fevers.  Pt states it is red in color.

## 2021-12-20 NOTE — ED Notes (Signed)
Patient given Vicodin 5/325 mg pak from pyxis; witnessed by [place], Charity fundraiser. This RN and patient signed Rx. Rx then placed in Rx tub next to ED pyxis.

## 2021-12-20 NOTE — ED Notes (Signed)
See triage notes. Area is red, no drainage noted.

## 2021-12-21 MED FILL — Hydrocodone-Acetaminophen Tab 5-325 MG: ORAL | Qty: 6 | Status: AC

## 2022-07-10 DIAGNOSIS — F1320 Sedative, hypnotic or anxiolytic dependence, uncomplicated: Secondary | ICD-10-CM | POA: Diagnosis not present

## 2022-07-10 DIAGNOSIS — R030 Elevated blood-pressure reading, without diagnosis of hypertension: Secondary | ICD-10-CM | POA: Diagnosis not present

## 2022-07-10 DIAGNOSIS — F1120 Opioid dependence, uncomplicated: Secondary | ICD-10-CM | POA: Diagnosis not present

## 2022-08-05 DIAGNOSIS — R030 Elevated blood-pressure reading, without diagnosis of hypertension: Secondary | ICD-10-CM | POA: Diagnosis not present

## 2022-08-05 DIAGNOSIS — R7309 Other abnormal glucose: Secondary | ICD-10-CM | POA: Diagnosis not present

## 2022-08-05 DIAGNOSIS — F1120 Opioid dependence, uncomplicated: Secondary | ICD-10-CM | POA: Diagnosis not present

## 2022-12-14 DIAGNOSIS — F1120 Opioid dependence, uncomplicated: Secondary | ICD-10-CM | POA: Diagnosis not present

## 2022-12-14 DIAGNOSIS — G4700 Insomnia, unspecified: Secondary | ICD-10-CM | POA: Diagnosis not present

## 2022-12-14 DIAGNOSIS — F419 Anxiety disorder, unspecified: Secondary | ICD-10-CM | POA: Diagnosis not present

## 2023-11-04 ENCOUNTER — Encounter (HOSPITAL_COMMUNITY): Payer: Self-pay

## 2023-11-04 ENCOUNTER — Emergency Department (HOSPITAL_COMMUNITY)
Admission: EM | Admit: 2023-11-04 | Discharge: 2023-11-04 | Disposition: A | Discharge status: Home / Self Care | Attending: Emergency Medicine | Admitting: Emergency Medicine

## 2023-11-04 ENCOUNTER — Emergency Department (HOSPITAL_COMMUNITY)

## 2023-11-04 ENCOUNTER — Other Ambulatory Visit: Payer: Self-pay

## 2023-11-04 DIAGNOSIS — Z87891 Personal history of nicotine dependence: Secondary | ICD-10-CM | POA: Diagnosis not present

## 2023-11-04 DIAGNOSIS — M25561 Pain in right knee: Secondary | ICD-10-CM | POA: Diagnosis present

## 2023-11-04 MED ORDER — KETOROLAC TROMETHAMINE 15 MG/ML IJ SOLN
30.0000 mg | Freq: Once | INTRAMUSCULAR | Status: AC
  Administered 2023-11-04: 30 mg via INTRAMUSCULAR
  Filled 2023-11-04: qty 2

## 2023-11-04 MED ORDER — NAPROXEN 500 MG PO TABS: 500.0000 mg | ORAL_TABLET | Freq: Two times a day (BID) | ORAL | 0 refills | Status: AC

## 2023-11-04 MED ORDER — OXYCODONE HCL 5 MG PO TABS
5.0000 mg | ORAL_TABLET | Freq: Once | ORAL | Status: AC
  Administered 2023-11-04: 5 mg via ORAL
  Filled 2023-11-04: qty 1

## 2023-11-04 NOTE — Discharge Instructions (Signed)
 You were evaluated in the Emergency Department and after careful evaluation, we did not find any emergent condition requiring admission or further testing in the hospital.  Your exam/testing today is overall reassuring.  Recommend returning for ultrasound in the morning as we discussed.  Can use the Naprosyn  anti-inflammatory twice daily for pain.  Please return to the Emergency Department if you experience any worsening of your condition.   Thank you for allowing us  to be a part of your care.

## 2023-11-04 NOTE — ED Triage Notes (Signed)
 Pt c/o right knee pain and wants xray. Denies injury.   Also c/o lump right armpit that's been there x1 week. Goes away and comes back.

## 2023-11-04 NOTE — ED Provider Notes (Signed)
 [facility]-EMER[facility]ENCY DEPT [facility] Emergency Department Provider Note MRN:  [id number]  Arrival date & time: 11/04/23     Chief Complaint   Knee pain History of Present Illness   Alicia Barnett is a 48 y.o. year-old female with a history of substance use disorder presenting to the ED with chief complaint of knee pain.  Pain to the right anterior knee with swelling and discomfort.  Denies injury.  No other complaints.  Review of Systems  A thorough review of systems was obtained and all systems are negative except as noted in the HPI and PMH.   Patient's Health History    Past Medical History:  Diagnosis Date   Anemia    Anxiety    Depression    Polysubstance abuse (HCC)    Sciatica    Vaginal Pap smear, abnormal     Past Surgical History:  Procedure Laterality Date   LAPAROSCOPIC BILATERAL SALPINGECTOMY Bilateral 02/13/2016   Procedure: LAPAROSCOPIC BILATERAL SALPINGECTOMY;  Surgeon: Albino Hum, MD;  Location: [facility];  Service: Gynecology;  Laterality: Bilateral;   LEEP     NO PAST SURGERIES     TUBAL LIGATION      Family History  Problem Relation Age of Onset   Heart failure Mother    Hypertension Mother    Pulmonary embolism Mother 9       PE after surgery   Cancer Maternal Grandmother        lung   Diabetes Maternal Grandfather    Cancer Maternal Grandfather        liver   Asthma Daughter    Cancer Other     Social History   Socioeconomic History   Marital status: Single    Spouse name: Marlou Sims   Number of children: 4   Years of education: 12   Highest education level: Not on file  Occupational History   Occupation: food service    Comment: [employer]  Tobacco Use   Smoking status: Former    Current packs/day: 0.75    Average packs/day: 0.8 packs/day for 23.0 years (17.3 ttl pk-yrs)    Types: Cigarettes   Smokeless tobacco: Never  Vaping Use   Vaping status: Every Day  Substance and Sexual Activity   Alcohol use: Not Currently     Alcohol/week: 9.0 standard drinks of alcohol    Types: 9 Cans of beer per week    Comment: beer occ   Drug use: Yes    Frequency: 7.0 times per week    Types: Marijuana    Comment: "once in a while"   Sexual activity: Yes    Birth control/protection: Surgical    Comment: tubal  Other Topics Concern   Not on file  Social History Narrative   Lives with Film/video editor and 57 month old son- Johnnette Nakayama   Daughter is there to help   Kimble Pennant mother helps with child care   Social Drivers of Health   Financial Resource Strain: Not on file  Food Insecurity: Not on file  Transportation Needs: Not on file  Physical Activity: Not on file  Stress: Not on file  Social Connections: Not on file  Intimate Partner Violence: Not on file     Physical Exam   Vitals:   11/04/23 0135  BP: 112/77  Pulse: (!) 114  Resp: 18  Temp: 98.2 F (36.8 C)  SpO2: 98%    CONSTITUTIONAL: Well-appearing, NAD NEURO/PSYCH:  Alert and oriented x 3, no focal deficits EYES:  eyes equal  and reactive ENT/NECK:  no LAD, no JVD CARDIO: Regular rate, well-perfused, normal S1 and S2 PULM:  CTAB no wheezing or rhonchi GI/GU:  non-distended, non-tender MSK/SPINE:  No gross deformities, no edema SKIN:  no rash, atraumatic   *Additional and/or pertinent findings included in MDM below  Diagnostic and Interventional Summary    EKG Interpretation Date/Time:    Ventricular Rate:    PR Interval:    QRS Duration:    QT Interval:    QTC Calculation:   R Axis:      Text Interpretation:         Labs Reviewed - No data to display  DG Knee Right Port  Final Result    US  Venous Img Lower Unilateral Right    (Results Pending)    Medications  ketorolac  (TORADOL ) 15 MG/ML injection 30 mg (30 mg Intramuscular Given 11/04/23 0254)  oxyCODONE  (Oxy IR/ROXICODONE ) immediate release tablet 5 mg (5 mg Oral Given 11/04/23 0253)     Procedures  /  Critical Care Procedures  ED Course and Medical Decision Making  Initial  Impression and Ddx Patient has a fullness and tender swelling just above the right kneecap, suspicious for bursitis given the focal nature.  DVT felt to be much less likely given this exam finding.  Patient's mother died of a blood clot in her 69s and so this is patient's main concern.  No trauma.  Neurovascularly intact distally  Past medical/surgical history that increases complexity of ED encounter: None  Interpretation of Diagnostics I personally reviewed the knee x-ray and my interpretation is as follows: No fracture    Patient Reassessment and Ultimate Disposition/Management     Discharge  Patient management required discussion with the following services or consulting groups:  None  Complexity of Problems Addressed Acute complicated illness or Injury  Additional Data Reviewed and Analyzed Further history obtained from: None  Additional Factors Impacting ED Encounter Risk Prescriptions  Merrick Abe. Harless Lien, MD [facility] [address] [facility] Emergency [facility] [email or web address]  Final Clinical Impressions(s) / ED Diagnoses     ICD-10-CM   1. Acute pain of right knee  M25.561       ED Discharge Orders          Ordered    US  Venous Img Lower Unilateral Right        11/04/23 0318    naproxen  (NAPROSYN ) 500 MG tablet  2 times daily        11/04/23 1610             Discharge Instructions Discussed with and Provided to Patient:    Discharge Instructions      You were evaluated in the Emergency Department and after careful evaluation, we did not find any emergent condition requiring admission or further testing in the hospital.  Your exam/testing today is overall reassuring.  Recommend returning for ultrasound in the morning as we discussed.  Can use the Naprosyn  anti-inflammatory twice daily for pain.  Please return to the Emergency Department if you experience any worsening of your condition.   Thank you for allowing us  to be a part of your  care.      Edson Graces, MD 11/04/23 [phone]

## 2023-11-10 ENCOUNTER — Ambulatory Visit (HOSPITAL_COMMUNITY): Admission: RE | Admit: 2023-11-10 | Source: Ambulatory Visit

## 2024-01-20 ENCOUNTER — Emergency Department (HOSPITAL_COMMUNITY)
Admission: EM | Admit: 2024-01-20 | Discharge: 2024-01-20 | Disposition: A | Discharge status: Home / Self Care | Attending: Emergency Medicine | Admitting: Emergency Medicine

## 2024-01-20 DIAGNOSIS — K047 Periapical abscess without sinus: Secondary | ICD-10-CM | POA: Insufficient documentation

## 2024-01-20 DIAGNOSIS — K0889 Other specified disorders of teeth and supporting structures: Secondary | ICD-10-CM | POA: Diagnosis present

## 2024-01-20 DIAGNOSIS — L03112 Cellulitis of left axilla: Secondary | ICD-10-CM

## 2024-01-20 MED ORDER — CLINDAMYCIN HCL 300 MG PO CAPS: 300.0000 mg | ORAL_CAPSULE | Freq: Four times a day (QID) | ORAL | 0 refills | Status: AC

## 2024-01-20 NOTE — ED Triage Notes (Signed)
 Pt comes in for a tooth pain and abscess in left armpit. Pt has a cracked tooth in the right upper portion of the mouth. Pt has a dental appointment as soon as abx is completed (she needs abx). A&Ox4.

## 2024-01-20 NOTE — Discharge Instructions (Signed)
 Please take all antibiotics as directed.  Follow-up closely with your primary care doctor and dentist on an outpatient basis.  Return to emergency department immediately for any new or worsening symptoms.

## 2024-01-20 NOTE — ED Notes (Signed)
 ED Provider at bedside.

## 2024-01-20 NOTE — ED Provider Notes (Signed)
 [place] EMERGENCY DEPARTMENT AT [facility] Provider Note   CSN: [id number] Arrival date & time: 01/20/24  2009     Patient presents with: Dental Pain and Abscess (Left armpit)   Alicia Barnett is a 48 y.o. female.   Patient is a 48 year old female who presents emergency department the chief complaint of right upper dental pain as well as an area of swelling to the left axilla.  Patient notes that symptoms have been ongoing for the past few days.  Patient presents today asking for antibiotics as she does need to be taking them before she can see her dentist.  She denies any associated stridor, dysphagia, drooling, dyspnea.  She has had no fever or chills.   Dental Pain Abscess      Prior to Admission medications   Medication Sig Start Date End Date Taking? Authorizing Provider  acamprosate (CAMPRAL) 333 MG tablet Take 666 mg by mouth 3 (three) times daily. 11/24/23   [provider]  baclofen (LIORESAL) 10 MG tablet Take 10 mg by mouth 4 (four) times daily as needed for muscle spasms.    [provider]  clonazePAM  (KLONOPIN ) 2 MG tablet Take 2 mg by mouth 2 (two) times daily.    [provider]  cloNIDine (CATAPRES) 0.1 MG tablet Take 0.1 mg by mouth 3 (three) times daily. 10/26/23   [provider]  clotrimazole  (LOTRIMIN ) 1 % cream Apply to affected area 2 times daily 12/20/21   Triplett, Tammy, PA-C  divalproex (DEPAKOTE) 500 MG DR tablet Take 500 mg by mouth daily. 11/01/23   [provider]  doxycycline  (VIBRAMYCIN ) 100 MG capsule Take 1 capsule (100 mg total) by mouth 2 (two) times daily. 12/20/21   Triplett, Tammy, PA-C  DULoxetine  (CYMBALTA ) 60 MG capsule Take 1 capsule (60 mg total) by mouth daily. Patient taking differently: Take 120 mg by mouth daily.  05/02/17   Maranda Jamee Jacob, MD  gabapentin  (NEURONTIN ) 800 MG tablet Take 800 mg by mouth 3 (three) times daily. 10/28/23   [provider]   HYDROcodone -acetaminophen  (NORCO/VICODIN) 5-325 MG tablet Take 1 tablet by mouth every 4 (four) hours as needed. 12/20/21   Triplett, Tammy, PA-C  naloxone (NARCAN) nasal spray 4 mg/0.1 mL Place 1 spray into the nose once. 08/09/23   [provider]  naproxen  (NAPROSYN ) 500 MG tablet Take 1 tablet (500 mg total) by mouth 2 (two) times daily. 11/04/23   Theadore Ozell HERO, MD  QUEtiapine (SEROQUEL) 50 MG tablet Take 50 mg by mouth at bedtime. 11/01/23   [provider]  SUBOXONE 8-2 MG FILM Place 0.5 Film under the tongue 4 (four) times daily. 08/08/23   [provider]    Allergies: Codeine    Review of Systems  HENT:  Positive for dental problem.   Skin:        Swelling to left axilla  All other systems reviewed and are negative.   Updated Vital Signs BP (!) 134/95 (BP Location: Right Arm)   Pulse 89   Temp 98.5 F (36.9 C)   Resp 17   Ht 5' 4 (1.626 m)   Wt 117.9 kg   SpO2 93%   BMI 44.63 kg/m   Physical Exam Vitals and nursing note reviewed.  Constitutional:      Appearance: Normal appearance.  HENT:     Head: Normocephalic and atraumatic.     Nose: Nose normal.     Mouth/Throat:     Mouth: Mucous membranes are  moist.     Comments: Mild swelling noted to the right upper gumline, floor mouth is soft, tolerant secretion without difficulty, no peritonsillar swelling Eyes:     Extraocular Movements: Extraocular movements intact.     Conjunctiva/sclera: Conjunctivae normal.     Pupils: Pupils are equal, round, and reactive to light.  Cardiovascular:     Rate and Rhythm: Normal rate and regular rhythm.     Pulses: Normal pulses.     Heart sounds: Normal heart sounds.  Pulmonary:     Effort: Pulmonary effort is normal. No respiratory distress.     Breath sounds: Normal breath sounds. No stridor. No wheezing, rhonchi or rales.  Musculoskeletal:        General: Normal range of motion.     Cervical back: Normal range of motion and neck supple. No  rigidity or tenderness.  Lymphadenopathy:     Cervical: No cervical adenopathy.  Skin:    General: Skin is warm and dry.     Comments: Small area of induration and erythema noted to left axilla, no areas of fluctuance  Neurological:     General: No focal deficit present.     Mental Status: She is alert and oriented to person, place, and time. Mental status is at baseline.     (all labs ordered are listed, but only abnormal results are displayed) Labs Reviewed - No data to display  EKG: None  Radiology: No results found.   Procedures   Medications Ordered in the ED - No data to display                                  Medical Decision Making Patient is doing well at this time and is stable for discharge home.  Patient has a small area of induration to the left axilla with no areas of an obvious drainable abscess at this point.  She does appear to have a dental infection as well.  Will cover with clindamycin  for both at this point.  Patient has stable vital signs with no indication for sepsis.  She has no signs of acute airway compromise.  She has no indication for acute peritonsillar abscess, Ludwig angina, retropharyngeal abscess, epiglottitis.  The need for close follow-up with primary care doctor and dentist was discussed.  Strict turn precautions were provided for any new or worsening symptoms.  Patient voiced understanding and had no additional questions.  Risk Prescription drug management.        Final diagnoses:  None    ED Discharge Orders     None          [name] 01/20/24 2113    [name], MD 01/21/24 [phone]
